# Patient Record
Sex: Female | Born: 1935 | Race: White | Hispanic: Yes | Smoking: Never smoker
Health system: Southern US, Community
[De-identification: ages and names within clinical notes are randomized; demographics above are authoritative.]

## PROBLEM LIST (undated history)

## (undated) DIAGNOSIS — F329 Major depressive disorder, single episode, unspecified: Secondary | ICD-10-CM

## (undated) DIAGNOSIS — C801 Malignant (primary) neoplasm, unspecified: Secondary | ICD-10-CM

## (undated) DIAGNOSIS — IMO0002 Reserved for concepts with insufficient information to code with codable children: Secondary | ICD-10-CM

## (undated) DIAGNOSIS — M810 Age-related osteoporosis without current pathological fracture: Secondary | ICD-10-CM

## (undated) DIAGNOSIS — F32A Depression, unspecified: Secondary | ICD-10-CM

## (undated) DIAGNOSIS — I10 Essential (primary) hypertension: Secondary | ICD-10-CM

## (undated) DIAGNOSIS — F419 Anxiety disorder, unspecified: Secondary | ICD-10-CM

## (undated) DIAGNOSIS — M1990 Unspecified osteoarthritis, unspecified site: Secondary | ICD-10-CM

## (undated) DIAGNOSIS — E119 Type 2 diabetes mellitus without complications: Secondary | ICD-10-CM

## (undated) DIAGNOSIS — IMO0001 Reserved for inherently not codable concepts without codable children: Secondary | ICD-10-CM

## (undated) DIAGNOSIS — N6320 Unspecified lump in the left breast, unspecified quadrant: Secondary | ICD-10-CM

## (undated) DIAGNOSIS — R569 Unspecified convulsions: Secondary | ICD-10-CM

## (undated) DIAGNOSIS — K219 Gastro-esophageal reflux disease without esophagitis: Secondary | ICD-10-CM

## (undated) HISTORY — PX: CERVICAL BIOPSY: SHX590

## (undated) HISTORY — DX: Reserved for inherently not codable concepts without codable children: IMO0001

## (undated) HISTORY — DX: Reserved for concepts with insufficient information to code with codable children: IMO0002

---

## 2007-08-06 ENCOUNTER — Emergency Department (HOSPITAL_COMMUNITY): Admission: EM | Admit: 2007-08-06 | Discharge: 2007-08-06 | Payer: Self-pay | Admitting: Emergency Medicine

## 2014-12-14 DIAGNOSIS — R569 Unspecified convulsions: Secondary | ICD-10-CM

## 2014-12-14 HISTORY — DX: Unspecified convulsions: R56.9

## 2014-12-15 ENCOUNTER — Emergency Department (HOSPITAL_COMMUNITY): Payer: Medicare Other

## 2014-12-15 ENCOUNTER — Encounter (HOSPITAL_COMMUNITY): Payer: Self-pay | Admitting: *Deleted

## 2014-12-15 ENCOUNTER — Observation Stay (HOSPITAL_COMMUNITY)
Admission: EM | Admit: 2014-12-15 | Discharge: 2014-12-16 | Disposition: A | Discharge status: Home / Self Care | Payer: Medicare Other | Attending: Internal Medicine | Admitting: Internal Medicine

## 2014-12-15 DIAGNOSIS — M810 Age-related osteoporosis without current pathological fracture: Secondary | ICD-10-CM | POA: Diagnosis not present

## 2014-12-15 DIAGNOSIS — Z79899 Other long term (current) drug therapy: Secondary | ICD-10-CM | POA: Diagnosis not present

## 2014-12-15 DIAGNOSIS — F329 Major depressive disorder, single episode, unspecified: Secondary | ICD-10-CM | POA: Insufficient documentation

## 2014-12-15 DIAGNOSIS — E1165 Type 2 diabetes mellitus with hyperglycemia: Secondary | ICD-10-CM | POA: Insufficient documentation

## 2014-12-15 DIAGNOSIS — N63 Unspecified lump in breast: Secondary | ICD-10-CM | POA: Diagnosis not present

## 2014-12-15 DIAGNOSIS — E871 Hypo-osmolality and hyponatremia: Secondary | ICD-10-CM | POA: Diagnosis not present

## 2014-12-15 DIAGNOSIS — N6320 Unspecified lump in the left breast, unspecified quadrant: Secondary | ICD-10-CM | POA: Diagnosis present

## 2014-12-15 DIAGNOSIS — E119 Type 2 diabetes mellitus without complications: Secondary | ICD-10-CM

## 2014-12-15 DIAGNOSIS — M1990 Unspecified osteoarthritis, unspecified site: Secondary | ICD-10-CM | POA: Diagnosis not present

## 2014-12-15 DIAGNOSIS — R630 Anorexia: Secondary | ICD-10-CM | POA: Diagnosis not present

## 2014-12-15 DIAGNOSIS — I10 Essential (primary) hypertension: Secondary | ICD-10-CM | POA: Diagnosis not present

## 2014-12-15 DIAGNOSIS — R55 Syncope and collapse: Secondary | ICD-10-CM | POA: Diagnosis not present

## 2014-12-15 DIAGNOSIS — R000 Tachycardia, unspecified: Secondary | ICD-10-CM | POA: Diagnosis not present

## 2014-12-15 DIAGNOSIS — E860 Dehydration: Secondary | ICD-10-CM | POA: Diagnosis not present

## 2014-12-15 DIAGNOSIS — R5383 Other fatigue: Secondary | ICD-10-CM

## 2014-12-15 DIAGNOSIS — R739 Hyperglycemia, unspecified: Secondary | ICD-10-CM

## 2014-12-15 HISTORY — DX: Unspecified lump in the left breast, unspecified quadrant: N63.20

## 2014-12-15 HISTORY — DX: Depression, unspecified: F32.A

## 2014-12-15 HISTORY — DX: Gastro-esophageal reflux disease without esophagitis: K21.9

## 2014-12-15 HISTORY — DX: Unspecified convulsions: R56.9

## 2014-12-15 HISTORY — DX: Essential (primary) hypertension: I10

## 2014-12-15 HISTORY — DX: Unspecified osteoarthritis, unspecified site: M19.90

## 2014-12-15 HISTORY — DX: Age-related osteoporosis without current pathological fracture: M81.0

## 2014-12-15 HISTORY — DX: Major depressive disorder, single episode, unspecified: F32.9

## 2014-12-15 HISTORY — DX: Type 2 diabetes mellitus without complications: E11.9

## 2014-12-15 LAB — CBC WITH DIFFERENTIAL/PLATELET
BASOS PCT: 0 % (ref 0–1)
Basophils Absolute: 0 10*3/uL (ref 0.0–0.1)
EOS ABS: 0 10*3/uL (ref 0.0–0.7)
Eosinophils Relative: 0 % (ref 0–5)
HCT: 38 % (ref 36.0–46.0)
HEMOGLOBIN: 12.9 g/dL (ref 12.0–15.0)
LYMPHS ABS: 1.2 10*3/uL (ref 0.7–4.0)
Lymphocytes Relative: 13 % (ref 12–46)
MCH: 28.3 pg (ref 26.0–34.0)
MCHC: 33.9 g/dL (ref 30.0–36.0)
MCV: 83.3 fL (ref 78.0–100.0)
Monocytes Absolute: 0.4 10*3/uL (ref 0.1–1.0)
Monocytes Relative: 4 % (ref 3–12)
NEUTROS PCT: 83 % — AB (ref 43–77)
Neutro Abs: 7.9 10*3/uL — ABNORMAL HIGH (ref 1.7–7.7)
Platelets: 336 10*3/uL (ref 150–400)
RBC: 4.56 MIL/uL (ref 3.87–5.11)
RDW: 13.1 % (ref 11.5–15.5)
WBC: 9.5 10*3/uL (ref 4.0–10.5)

## 2014-12-15 LAB — BASIC METABOLIC PANEL
ANION GAP: 11 (ref 5–15)
BUN: 10 mg/dL (ref 6–23)
CALCIUM: 10 mg/dL (ref 8.4–10.5)
CO2: 21 mmol/L (ref 19–32)
CREATININE: 0.97 mg/dL (ref 0.50–1.10)
Chloride: 90 mmol/L — ABNORMAL LOW (ref 96–112)
GFR calc Af Amer: 64 mL/min — ABNORMAL LOW (ref >90)
GFR calc non Af Amer: 55 mL/min — ABNORMAL LOW (ref >90)
Glucose, Bld: 225 mg/dL — ABNORMAL HIGH (ref 70–99)
Potassium: 4.6 mmol/L (ref 3.5–5.1)
SODIUM: 122 mmol/L — AB (ref 135–145)

## 2014-12-15 LAB — URINALYSIS W MICROSCOPIC (NOT AT ARMC)
Bilirubin Urine: NEGATIVE
Glucose, UA: NEGATIVE mg/dL
Hgb urine dipstick: NEGATIVE
Ketones, ur: NEGATIVE mg/dL
Leukocytes, UA: NEGATIVE
NITRITE: NEGATIVE
PH: 7 (ref 5.0–8.0)
Protein, ur: NEGATIVE mg/dL
SPECIFIC GRAVITY, URINE: 1.009 (ref 1.005–1.030)
Urobilinogen, UA: 0.2 mg/dL (ref 0.0–1.0)

## 2014-12-15 LAB — POC OCCULT BLOOD, ED: FECAL OCCULT BLD: NEGATIVE

## 2014-12-15 LAB — GLUCOSE, CAPILLARY: GLUCOSE-CAPILLARY: 112 mg/dL — AB (ref 70–99)

## 2014-12-15 LAB — TROPONIN I: Troponin I: <0.03 ng/mL (ref <0.031)

## 2014-12-15 MED ORDER — IOHEXOL 350 MG/ML SOLN
80.0000 mL | Freq: Once | INTRAVENOUS | Status: AC | PRN
  Administered 2014-12-15: 80 mL via INTRAVENOUS

## 2014-12-15 MED ORDER — ENOXAPARIN SODIUM 40 MG/0.4ML ~~LOC~~ SOLN
40.0000 mg | SUBCUTANEOUS | Status: DC
  Administered 2014-12-15: 40 mg via SUBCUTANEOUS
  Filled 2014-12-15 (×3): qty 0.4

## 2014-12-15 MED ORDER — SODIUM CHLORIDE 0.9 % IJ SOLN: 3.0000 mL | Freq: Two times a day (BID) | INTRAMUSCULAR | Status: DC

## 2014-12-15 MED ORDER — SODIUM CHLORIDE 0.9 % IV SOLN: INTRAVENOUS | Status: DC

## 2014-12-15 MED ORDER — PANTOPRAZOLE SODIUM 40 MG PO TBEC
40.0000 mg | DELAYED_RELEASE_TABLET | Freq: Every day | ORAL | Status: DC
  Administered 2014-12-15 – 2014-12-16 (×2): 40 mg via ORAL
  Filled 2014-12-15 (×2): qty 1

## 2014-12-15 MED ORDER — SODIUM CHLORIDE 0.9 % IV SOLN
INTRAVENOUS | Status: AC
  Administered 2014-12-15 – 2014-12-16 (×2): via INTRAVENOUS

## 2014-12-15 MED ORDER — CALCIUM CARBONATE-VITAMIN D 500-200 MG-UNIT PO TABS
1.0000 | ORAL_TABLET | Freq: Every day | ORAL | Status: DC
  Administered 2014-12-15 – 2014-12-16 (×2): 1 via ORAL
  Filled 2014-12-15 (×3): qty 1

## 2014-12-15 MED ORDER — ACETAMINOPHEN 325 MG PO TABS: 650.0000 mg | ORAL_TABLET | Freq: Four times a day (QID) | ORAL | Status: DC | PRN

## 2014-12-15 MED ORDER — INSULIN ASPART 100 UNIT/ML ~~LOC~~ SOLN
0.0000 [IU] | Freq: Three times a day (TID) | SUBCUTANEOUS | Status: DC
  Administered 2014-12-15: 2 [IU] via SUBCUTANEOUS

## 2014-12-15 MED ORDER — ACETAMINOPHEN 650 MG RE SUPP: 650.0000 mg | Freq: Four times a day (QID) | RECTAL | Status: DC | PRN

## 2014-12-15 MED ORDER — ONDANSETRON HCL 4 MG/2ML IJ SOLN: 4.0000 mg | Freq: Four times a day (QID) | INTRAMUSCULAR | Status: DC | PRN

## 2014-12-15 MED ORDER — CYCLOBENZAPRINE HCL 10 MG PO TABS
10.0000 mg | ORAL_TABLET | Freq: Every day | ORAL | Status: DC
  Administered 2014-12-15 – 2014-12-16 (×2): 10 mg via ORAL
  Filled 2014-12-15 (×3): qty 1

## 2014-12-15 MED ORDER — SODIUM CHLORIDE 0.9 % IV SOLN: Freq: Once | INTRAVENOUS | Status: DC

## 2014-12-15 MED ORDER — MELOXICAM 15 MG PO TABS
15.0000 mg | ORAL_TABLET | Freq: Every day | ORAL | Status: DC
  Administered 2014-12-15 – 2014-12-16 (×2): 15 mg via ORAL
  Filled 2014-12-15 (×3): qty 1

## 2014-12-15 MED ORDER — SODIUM CHLORIDE 0.9 % IV BOLUS (SEPSIS)
500.0000 mL | Freq: Once | INTRAVENOUS | Status: AC
  Administered 2014-12-15: 500 mL via INTRAVENOUS

## 2014-12-15 MED ORDER — ALBUTEROL SULFATE (2.5 MG/3ML) 0.083% IN NEBU: 2.5000 mg | INHALATION_SOLUTION | RESPIRATORY_TRACT | Status: DC | PRN

## 2014-12-15 MED ORDER — SODIUM CHLORIDE 0.9 % IV BOLUS (SEPSIS): 1000.0000 mL | Freq: Once | INTRAVENOUS | Status: DC

## 2014-12-15 MED ORDER — ONDANSETRON HCL 4 MG PO TABS: 4.0000 mg | ORAL_TABLET | Freq: Four times a day (QID) | ORAL | Status: DC | PRN

## 2014-12-15 MED ORDER — LOSARTAN POTASSIUM 50 MG PO TABS
50.0000 mg | ORAL_TABLET | Freq: Every day | ORAL | Status: DC
  Administered 2014-12-15 – 2014-12-16 (×2): 50 mg via ORAL
  Filled 2014-12-15 (×3): qty 1

## 2014-12-15 NOTE — ED Notes (Signed)
Daughter's number Georgeanna Harrison [phone]

## 2014-12-15 NOTE — ED Provider Notes (Signed)
CSN: [id number]     Arrival date & time 12/15/14  1047 History   First MD Initiated Contact with Patient 12/15/14 1113     Chief Complaint  Patient presents with  . Loss of Consciousness     (Consider location/radiation/quality/duration/timing/severity/associated sxs/prior Treatment) HPI Comments: 80 year old female with high blood pressure, osteoporosis, diabetes presents after syncope. Patient had syncopal episode last night or sudden onset. Patient felt nauseated since in general fatigue. No cardiac history known. Patient has frontal headache gradual onset. Patient had arm infection 3 weeks prior and prescribed antibiotics. No current antibiotics. Patient did not want to be seen last night however symptoms persisted no syncope today. No valve problems known. Patient has noticed left breast mass the past 3 weeks no active cancer, no blood clot history, no recent surgeries, no unilateral leg symptoms.  Patient is a 80 y.o. female presenting with syncope. The history is provided by the patient and a relative. A language interpreter was used.  Loss of Consciousness Associated symptoms: headaches   Associated symptoms: no chest pain, no fever, no shortness of breath and no vomiting     Past Medical History  Diagnosis Date  . Diabetes mellitus without complication   . Hypertension   . Osteoporosis    History reviewed. No pertinent past surgical history. History reviewed. No pertinent family history. History  Substance Use Topics  . Smoking status: Never Smoker   . Smokeless tobacco: Not on file  . Alcohol Use: No   OB History    No data available     Review of Systems  Constitutional: Positive for appetite change and fatigue. Negative for fever and chills.  HENT: Negative for congestion.   Eyes: Negative for visual disturbance.  Respiratory: Negative for shortness of breath.   Cardiovascular: Positive for syncope. Negative for chest pain.  Gastrointestinal: Negative for  vomiting and abdominal pain.  Genitourinary: Negative for dysuria and flank pain.  Musculoskeletal: Negative for back pain, neck pain and neck stiffness.  Skin: Negative for rash.  Neurological: Positive for syncope, light-headedness and headaches.      Allergies  Review of patient's allergies indicates no known allergies.  Home Medications   Prior to Admission medications   Medication Sig Start Date End Date Taking? Authorizing Provider  alendronate (FOSAMAX) 70 MG tablet Take 70 mg by mouth once a week. Take with a full glass of water on an empty stomach.   Yes Historical Provider, MD  calcium-vitamin D (OSCAL WITH D) 250-125 MG-UNIT per tablet Take 1 tablet by mouth daily.   Yes Historical Provider, MD  cyclobenzaprine (FLEXERIL) 10 MG tablet Take 10 mg by mouth daily.   Yes Historical Provider, MD  linagliptin (TRADJENTA) 5 MG TABS tablet Take 5 mg by mouth daily.   Yes Historical Provider, MD  losartan-hydrochlorothiazide (HYZAAR) 50-12.5 MG per tablet Take 1 tablet by mouth daily.   Yes Historical Provider, MD  meloxicam (MOBIC) 15 MG tablet Take 15 mg by mouth daily.   Yes Historical Provider, MD  omeprazole (PRILOSEC) 40 MG capsule Take 40 mg by mouth daily.   Yes Historical Provider, MD   BP 139/68 mmHg  Pulse 98  Temp(Src) 98.6 F (37 C) (Rectal)  Resp 15  SpO2 99% Physical Exam  Constitutional: She is oriented to person, place, and time. She appears well-developed and well-nourished.  HENT:  Head: Normocephalic and atraumatic.  Mild dry membranes, no meningismus  Eyes: Conjunctivae are normal. Right eye exhibits no discharge. Left eye exhibits no discharge.  Neck: Normal range of motion. Neck supple. No tracheal deviation present.  Cardiovascular: Normal rate and regular rhythm.   Pulmonary/Chest: Effort normal and breath sounds normal.  Abdominal: Soft. She exhibits no distension. There is no tenderness. There is no guarding.  Musculoskeletal: She exhibits no  edema.  Neurological: She is alert and oriented to person, place, and time.  5+ strength in UE and LE with f/e at major joints. Sensation to palpation intact in UE and LE. CNs 2-12 grossly intact.  EOMFI.  PERRL.   Finger nose and coordination intact bilateral.   Visual fields intact to finger testing.   Skin: Skin is warm. No rash noted.  Patient has approximately 1.5 cm nodule left superior outer nipple region, no discharge the nipple nontender no erythema or warmth.  Psychiatric: She has a normal mood and affect.  Nursing note and vitals reviewed.   ED Course  Procedures (including critical care time) Labs Review Labs Reviewed  BASIC METABOLIC PANEL - Abnormal; Notable for the following:    Sodium 122 (*)    Chloride 90 (*)    Glucose, Bld 225 (*)    GFR calc non Af Amer 55 (*)    GFR calc Af Amer 64 (*)    All other components within normal limits  CBC WITH DIFFERENTIAL/PLATELET - Abnormal; Notable for the following:    Neutrophils Relative % 83 (*)    Neutro Abs 7.9 (*)    All other components within normal limits  TROPONIN I  URINALYSIS W MICROSCOPIC  POC OCCULT BLOOD, ED    Imaging Review Ct Head Wo Contrast  12/15/2014   CLINICAL DATA:  Syncopal episode last night, weakness, hypertension, diabetes  EXAM: CT HEAD WITHOUT CONTRAST  TECHNIQUE: Contiguous axial images were obtained from the base of the skull through the vertex without intravenous contrast.  COMPARISON:  None  FINDINGS: Generalized atrophy.  Normal ventricular morphology.  No midline shift or mass effect.  Mild minimal vessel chronic ischemic changes of deep cerebral white matter.  No intracranial hemorrhage, mass lesion, or acute infarction.  Small amount of fluid in LEFT frontal sinus.  Visualized paranasal sinuses and mastoid air cells otherwise clear.  Bones unremarkable.  IMPRESSION: Minimal small vessel chronic ischemic changes of deep cerebral white matter.  No acute intracranial abnormalities.    Electronically Signed   By: Lavonia Dana M.D.   On: 12/15/2014 13:15   Ct Angio Chest Pe W/cm &/or Wo Cm  12/15/2014   CLINICAL DATA:  Syncope. Fatigue and dizziness. Loss of consciousness.  EXAM: CT ANGIOGRAPHY CHEST WITH CONTRAST  TECHNIQUE: Multidetector CT imaging of the chest was performed using the standard protocol during bolus administration of intravenous contrast. Multiplanar CT image reconstructions and MIPs were obtained to evaluate the vascular anatomy.  CONTRAST:  63mL OMNIPAQUE IOHEXOL 350 MG/ML SOLN  COMPARISON:  None.  FINDINGS: Negative for pulmonary embolism. Negative for aortic dissection or aneurysm. Heart size within normal limits. Minimal coronary artery calcification.  Small calcified granulomata in the lung apices bilaterally. Negative for mass or adenopathy  Negative for pneumonia. Negative for pleural effusion. Mild dependent atelectasis bilaterally.  Large hemangioma fills the T8 vertebral body without fracture or extraosseous soft tissue component.  Upper abdomen negative  Review of the MIP images confirms the above findings.  IMPRESSION: Negative for pulmonary emboli.  No acute abnormality.   Electronically Signed   By: Franchot Gallo M.D.   On: 12/15/2014 13:44     EKG Interpretation   Date/Time:  Monday December 15 2014 10:49:19 EST Ventricular Rate:  104 PR Interval:  200 QRS Duration: 64 QT Interval:  332 QTC Calculation: 436 R Axis:   -57 Text Interpretation:  Sinus tachycardia Left axis deviation Low voltage  QRS Cannot rule out Anterior infarct , age undetermined Abnormal ECG  Confirmed by Ahkeem Goede  MD, Misk Galentine ([id number]) on 12/15/2014 11:38:06 AM      MDM   Final diagnoses:  Hyponatremia  Other fatigue  Hyperglycemia  Dehydration  Left breast mass   Patient presents with new-onset syncope, no car tachycardia history known. Patient clinically dehydrated, fluid bolus given. Sodium low, patient on hydrochlorothiazide. CT angina chest no blood clot, EKG mild  tachycardia reviewed.  Discussed with hospitalist for observation and IV fluids. CT head results reviewed no acute findings.  The patients results and plan were reviewed and discussed.   Any x-rays performed were personally reviewed by myself.   Differential diagnosis were considered with the presenting HPI.  Medications  0.9 %  sodium chloride infusion (not administered)  sodium chloride 0.9 % bolus 500 mL (0 mLs Intravenous Stopped 12/15/14 1244)  iohexol (OMNIPAQUE) 350 MG/ML injection 80 mL (80 mLs Intravenous Contrast Given 12/15/14 1253)  0.9 %  sodium chloride infusion ( Intravenous New Bag/Given 12/15/14 1358)    Filed Vitals:   12/15/14 1054 12/15/14 1145 12/15/14 1213 12/15/14 1215  BP: 147/76 130/70  139/68  Pulse: 104 88  98  Temp: 99 F (37.2 C)  98.6 F (37 C)   TempSrc: Oral  Rectal   Resp: 18 15  15   SpO2: 98% 97%  99%    Final diagnoses:  Hyponatremia  Other fatigue  Hyperglycemia  Dehydration  Left breast mass    Admission/ observation were discussed with the admitting physician, patient and/or family and they are comfortable with the plan.     Mariea Clonts, MD 12/15/14 [phone]
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
## 2014-12-15 NOTE — ED Notes (Signed)
Per family, patient was drinking coffee last night and started to feel sick. States that she got up to rinse cup out and passed out for approx 1 min with eyes rolled back. States prior to passing out patient felt dizzy, denies CP. States afterward patient was confused and tired. Reports patient currently feels fatigued and has a frontal HA. Increased dizziness with standing. Family states 3 weeks ago patient had a fever and was prescribed antibiotics from PCP for a left hand/arm infection that has cleared up. Hx of diabetes and HTN. Family checked CBG when she passed out and said it was 164. States CBG was 124 this AM.

## 2014-12-15 NOTE — ED Notes (Signed)
Pt and family reports pt had syncopal episode last night, denies pain but is having fatigue since last night.

## 2014-12-15 NOTE — Progress Notes (Signed)
Patient arrived on unit via stretcher from ED. No family at bedside. Telemetry placed per MD order.

## 2014-12-16 DIAGNOSIS — E119 Type 2 diabetes mellitus without complications: Secondary | ICD-10-CM | POA: Diagnosis not present

## 2014-12-16 DIAGNOSIS — E860 Dehydration: Secondary | ICD-10-CM | POA: Diagnosis not present

## 2014-12-16 DIAGNOSIS — E871 Hypo-osmolality and hyponatremia: Secondary | ICD-10-CM | POA: Diagnosis not present

## 2014-12-16 DIAGNOSIS — R55 Syncope and collapse: Secondary | ICD-10-CM | POA: Diagnosis not present

## 2014-12-16 LAB — CBC
HEMATOCRIT: 34.8 % — AB (ref 36.0–46.0)
HEMOGLOBIN: 11.7 g/dL — AB (ref 12.0–15.0)
MCH: 28.6 pg (ref 26.0–34.0)
MCHC: 33.6 g/dL (ref 30.0–36.0)
MCV: 85.1 fL (ref 78.0–100.0)
Platelets: 286 10*3/uL (ref 150–400)
RBC: 4.09 MIL/uL (ref 3.87–5.11)
RDW: 13.3 % (ref 11.5–15.5)
WBC: 7 10*3/uL (ref 4.0–10.5)

## 2014-12-16 LAB — BASIC METABOLIC PANEL
ANION GAP: 10 (ref 5–15)
BUN: 10 mg/dL (ref 6–23)
CO2: 23 mmol/L (ref 19–32)
CREATININE: 0.81 mg/dL (ref 0.50–1.10)
Calcium: 9.3 mg/dL (ref 8.4–10.5)
Chloride: 102 mmol/L (ref 96–112)
GFR calc Af Amer: 79 mL/min — ABNORMAL LOW (ref >90)
GFR calc non Af Amer: 68 mL/min — ABNORMAL LOW (ref >90)
Glucose, Bld: 115 mg/dL — ABNORMAL HIGH (ref 70–99)
Potassium: 4.2 mmol/L (ref 3.5–5.1)
SODIUM: 135 mmol/L (ref 135–145)

## 2014-12-16 LAB — GLUCOSE, CAPILLARY
GLUCOSE-CAPILLARY: 179 mg/dL — AB (ref 70–99)
GLUCOSE-CAPILLARY: 131 mg/dL — AB (ref 70–99)
Glucose-Capillary: 121 mg/dL — ABNORMAL HIGH (ref 70–99)
Glucose-Capillary: 109 mg/dL — ABNORMAL HIGH (ref 70–99)

## 2014-12-16 MED ORDER — LOSARTAN POTASSIUM 50 MG PO TABS: 50.0000 mg | ORAL_TABLET | Freq: Every day | ORAL | Status: DC

## 2014-12-16 MED ORDER — POLYETHYLENE GLYCOL 3350 17 G PO PACK
17.0000 g | PACK | Freq: Every day | ORAL | Status: DC
  Filled 2014-12-16: qty 1

## 2014-12-16 NOTE — Progress Notes (Signed)
UR completed 

## 2014-12-16 NOTE — Progress Notes (Signed)
Echocardiogram 2D Echocardiogram has been performed.  Amanda Schneider 12/16/2014, 11:37 AM
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
## 2014-12-16 NOTE — Evaluation (Signed)
Physical Therapy Evaluation Patient Details Name: Amanda Schneider MRN: [id number] DOB: 27-Nov-1934 Today's Date: 12/16/2014   History of Present Illness  Pt is a 80 y.o. female Spanish-speaking, PMH of DM 2, HTN, osteoporosis, presented to the [facility] ED on 12/15/14 with complaints of passing out 1 night PTA and fatigue. 3 weeks ago patient was treated for left hand infection with antibiotics. Since then patient has had poor appetite but no other complaints. Last night, she was in her usual state of health and returned from church at approximately 9 PM. She was having some coffee and half way through her drink, she started feeling upset in the stomach and nauseous. She got up and went to the kitchen sink to rinse her coffee cup when she abruptly passed out. The family saw her fall backwards onto the floor. She passed out for approximately 1 minute with eyes rolled upwards but no seizure-like activity or urinary incontinence. When she woke up she was transiently confused for about 20 minutes. She complained of some headache on the right occipital area at the site of trauma. This morning she woke up and complained of feeling generally fatigued and pain in the forehead.   Clinical Impression  Pt admitted with above diagnosis. Pt currently with functional limitations due to the deficits listed below (see PT Problem List). At the time of PT eval pt was able to perform transfers and ambulation with supervision or min guard assist. Pt able to ambulate well with the RW, and feel pt would benefit from using the walker in the community/long distances. Pt will benefit from skilled PT to increase their independence and safety with mobility to allow discharge to the venue listed below.  Pt unsure about HHPT to follow-up. Daughter reports that pt does not like being around strangers. Encouraged pt and daughter to consider having HHPT follow her at d/c for continued strengthening and balance training.       Follow Up Recommendations Home health PT    Equipment Recommendations  Rolling walker with 5" wheels (Tub bench/shower chair)    Recommendations for Other Services       Precautions / Restrictions Precautions Precautions: Fall Restrictions Weight Bearing Restrictions: No      Mobility  Bed Mobility Overal bed mobility: Needs Assistance Bed Mobility: Supine to Sit     Supine to sit: Supervision     General bed mobility comments: Pt was able to transition to EOB without assistance.   Transfers Overall transfer level: Needs assistance Equipment used: None Transfers: Sit to/from Stand Sit to Stand: Supervision         General transfer comment: Supervision for safety. Pt did not demonstrate unsteadiness or LOB.   Ambulation/Gait Ambulation/Gait assistance: Supervision;Min guard Ambulation Distance (Feet): 275 Feet Assistive device: Rolling walker (2 wheeled);None Gait Pattern/deviations: Step-through pattern;Decreased stride length;Trendelenburg Gait velocity: Decreased Gait velocity interpretation: Below normal speed for age/gender General Gait Details: Pt ambulated with the RW initially with supervision. No assist required. Once back in room pt was able to ambulate to the recliner with min guard assist and no LOB.   Stairs            Wheelchair Mobility    Modified Rankin (Stroke Patients Only)       Balance Overall balance assessment: No apparent balance deficits (not formally assessed)  Pertinent Vitals/Pain Pain Assessment: No/denies pain    Home Living Family/patient expects to be discharged to:: Private residence Living Arrangements: Children;Other relatives Available Help at Discharge: Family;Available 24 hours/day Type of Home: House Home Access: Stairs to enter   [employer] of Steps: 3 Home Layout: One level Home Equipment: Cane - single point      Prior  Function Level of Independence: Independent               Hand Dominance        Extremity/Trunk Assessment   Upper Extremity Assessment: Defer to OT evaluation           Lower Extremity Assessment: Overall WFL for tasks assessed      Cervical / Trunk Assessment: Normal  Communication   Communication: Prefers language other than [place];Interpreter utilized  Cognition Arousal/Alertness: Awake/alert Behavior During Therapy: WFL for tasks assessed/performed Overall Cognitive Status: Within Functional Limits for tasks assessed                      General Comments      Exercises        Assessment/Plan    PT Assessment Patient needs continued PT services  PT Diagnosis Difficulty walking   PT Problem List Decreased strength;Decreased range of motion;Decreased activity tolerance;Decreased balance;Decreased mobility;Decreased knowledge of use of DME;Decreased safety awareness;Decreased knowledge of precautions  PT Treatment Interventions DME instruction;Gait training;Stair training;Functional mobility training;Therapeutic activities;Therapeutic exercise;Neuromuscular re-education;Patient/family education   PT Goals (Current goals can be found in the Care Plan section) Acute Rehab PT Goals Patient Stated Goal: Pt did not state goals at this time.  PT Goal Formulation: With patient/family Time For Goal Achievement: 12/23/14 Potential to Achieve Goals: Good    Frequency Min 3X/week   Barriers to discharge        Co-evaluation               End of Session Equipment Utilized During Treatment: Gait belt Activity Tolerance: Patient tolerated treatment well Patient left: in chair;with call bell/phone within reach;with family/visitor present Nurse Communication: Mobility status    Functional Assessment Tool Used: Clinical judgement Functional Limitation: Mobility: Walking and moving around Mobility: Walking and Moving Around Current Status [phone]):  At least 1 percent but less than 20 percent impaired, limited or restricted Mobility: Walking and Moving Around Goal Status [phone]): At least 1 percent but less than 20 percent impaired, limited or restricted    Time: 1323-1345 PT Time Calculation (min) (ACUTE ONLY): 22 min   Charges:   PT Evaluation $Initial PT Evaluation Tier I: 1 Procedure     PT G Codes:   PT G-Codes **NOT FOR INPATIENT CLASS** Functional Assessment Tool Used: Clinical judgement Functional Limitation: Mobility: Walking and moving around Mobility: Walking and Moving Around Current Status (Y8016): At least 1 percent but less than 20 percent impaired, limited or restricted Mobility: Walking and Moving Around Goal Status [phone]): At least 1 percent but less than 20 percent impaired, limited or restricted    Rolinda Roan 12/16/2014, 2:01 PM   Rolinda Roan, PT, DPT Acute Rehabilitation Services Pager: [phone]

## 2014-12-16 NOTE — Care Management Note (Signed)
CARE MANAGEMENT NOTE 12/16/2014  Patient:  [facility]   Account Number:  [id number]  Date Initiated:  12/16/2014  Documentation initiated by:  Yader Criger  Subjective/Objective Assessment:   CM following for progression and d/c planning.     Action/Plan:   HHPT and Rolling walker ordered for this pt , family may decline HH once [facility] agency calls the home, however arrangements have been made.   Anticipated DC Date:  12/16/2014   Anticipated DC Plan:  [place]         Choice offered to / List presented to:     DME arranged  [place]      DME agency  [place] arranged  [place].   Status of service:  Completed, signed off Medicare Important Message given?   (If response is "NO", the following Medicare IM given date fields will be blank) Date Medicare IM given:   Medicare IM given by:   Date Additional Medicare IM given:   Additional Medicare IM given by:    Discharge Disposition:  [place]  Per UR Regulation:    If discussed at Long Length of Stay Meetings, dates discussed:    Comments:  12/16/2014 Pt will need an appointment at [place] to qualify for [facility] services. This CM attempted to discuss with pt daughter, however the daughter is not available. Will attempt to schedule an appointment for this pt tomorrow am.  Jasmine Pang RN MPH , case management [phone]

## 2014-12-17 NOTE — Care Management Note (Signed)
Late Entry:  CARE MANAGEMENT NOTE 12/17/2014  Patient:  A[facility]   Account Number:  [id number]  Date Initiated:  12/16/2014  Documentation initiated by:  Jean Skow  Subjective/Objective Assessment:   CM following for progression and d/c planning.     Action/Plan:   HHPT and Rolling walker ordered for this pt , family may decline HH once [facility] agency calls the home, however arrangements have been made.   Anticipated DC Date:  12/16/2014   Anticipated DC Plan:  [place]         Choice offered to / List presented to:     DME arranged  [place]      DME agency  [place] arranged  [place].   Status of service:  Completed, signed off Medicare Important Message given?   (If response is "NO", the following Medicare IM given date fields will be blank) Date Medicare IM given:   Medicare IM given by:   Date Additional Medicare IM given:   Additional Medicare IM given by:    Discharge Disposition:  [place]  Per UR Regulation:    If discussed at Long Length of Stay Meetings, dates discussed:    Comments:  12/17/2014 Appointment scheduled at [facility], then cancelled by this CM as we learned that the pt has a PCP in [place] [facility], Dr York Ram, [facility] notified of pt PCP.  CRoyal RN MPH, case manager, [phone]   12/16/2014 Pt will need an appointment at [place] to qualify for [facility] services. This CM attempted to discuss with pt daughter, however the daughter is not available. Will attempt to schedule an appointment for this pt tomorrow am.  Jasmine Pang RN MPH , case management [phone]

## 2014-12-19 ENCOUNTER — Inpatient Hospital Stay: Payer: Medicare Other

## 2015-01-06 ENCOUNTER — Other Ambulatory Visit: Payer: Self-pay | Admitting: Radiology

## 2015-01-19 ENCOUNTER — Other Ambulatory Visit: Payer: Self-pay | Admitting: Surgery

## 2015-01-20 ENCOUNTER — Encounter: Payer: Self-pay | Admitting: Hematology and Oncology

## 2015-01-20 ENCOUNTER — Ambulatory Visit (HOSPITAL_BASED_OUTPATIENT_CLINIC_OR_DEPARTMENT_OTHER): Payer: Medicare Other | Admitting: Hematology and Oncology

## 2015-01-20 ENCOUNTER — Ambulatory Visit: Payer: Medicare Other

## 2015-01-20 VITALS — BP 189/93 | HR 81 | Temp 97.5°F | Resp 18 | Ht 62.0 in | Wt 139.5 lb

## 2015-01-20 DIAGNOSIS — C50112 Malignant neoplasm of central portion of left female breast: Secondary | ICD-10-CM

## 2015-01-20 DIAGNOSIS — Z170 Estrogen receptor positive status [ER+]: Secondary | ICD-10-CM

## 2015-01-20 DIAGNOSIS — C50912 Malignant neoplasm of unspecified site of left female breast: Secondary | ICD-10-CM | POA: Insufficient documentation

## 2015-01-20 NOTE — Progress Notes (Signed)
Reports rcvd from [facility] 12/30/14 - 3d mammo, ultrasound, [place] bx, mammo post procedure, and 01/08/15 ofc visit notes.  Reviewed by Dr. Lindi Adie.  Sent to scan.  [facility] new pt referral rcvd with notes attached from Dr. [name].  Reviewed by Dr. Lindi Adie.  Sent to scan.  MD note created during office visit sent to scan.  Copy to patient.

## 2015-01-20 NOTE — Progress Notes (Signed)
Checked in new pt with no financial concerns at this time.  Pt has 2 insurances so financial assistance may not be needed but she has my card for any billing questions or concerns.  ° °

## 2015-01-20 NOTE — Progress Notes (Signed)
Location of Breast Cancer:left breast 1.2 cm lobulated mass  Histology per Pathology Report:01/06/15 Diagnosis Breast, left, needle core biopsy, 12 o'clock retroareolar area - INVASIVE DUCTAL CARCINOMA, SEE COMMENT.  Receptor Status: ER(+, PR (+), Her2-neu (-)  Did patient present with symptoms (if so, please note symptoms) or was this found on screening mammography?:Patient palpated mass during shower which was confirmed by mammogram and ultrasound at [facility].  Past/Anticipated interventions by surgeon, if any:  Past/Anticipated interventions by medical oncology, if any: Chemotherapy, if oncotype less than 21 recommendation would be to have radiation followed by anti-estrogen. Awaiting oncotype DX  Lymphedema issues, if any:No  Pain issues, if any:No  SAFETY ISSUES:  Prior radiation?No  Pacemaker/ICD? No  Possible current pregnancy?No  Is the patient on methotrexate?No  Current Complaints / other details: Widowed Menarche age 4.first child age 66 or 86 10 children, 56 living, one died at 100 days. Last menstrual period age 66.No hormonal replacement therapy. Had 16 pregnancies. Lives with daughter Peter [place]. NKDA No smoking history Seizure 12/15/14    Arlyss Repress, RN 01/20/2015,4:04 PM

## 2015-01-20 NOTE — Assessment & Plan Note (Signed)
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 After reviewing the pathology in detail, we proceeded to discuss the different treatment options between surgery, radiation, chemotherapy, antiestrogen therapies.  Recommendation: 1. Breast conserving surgery followed by 2. Testing for Oncotype DX to determine if she would benefit from chemotherapy followed by 3. Radiation therapy followed by 4. Antiestrogen therapy.  Oncotype DX counseling: I discussed with the patient Oncotype DX is a 21 gene assay that can help predict if someone needs chemotherapy but based on her clinical criteria it appears that she may be low risk. However patient would like to know if she does need chemotherapy and hence I will send for Oncotype DX testing after we review the final pathology.  Return to clinic after surgery to discuss the final pathology result.

## 2015-01-20 NOTE — Progress Notes (Signed)
[facility] CONSULT NOTE  Patient Care Team: Pcp Not In System as PCP - General  CHIEF COMPLAINTS/PURPOSE OF CONSULTATION:  Newly diagnosed breast cancer  HISTORY OF PRESENTING ILLNESS:  Amanda Schneider 80 y.o. female is here because of recent diagnosis of left breast cancer. She is a Hispanic speaking lady who is here accompanied by her daughter. She was having a rash shower and felt a lump in the left breast. This was evaluated for a mammogram and ultrasound at [facility] that revealed a 1.2 cm lesion in the left breast. This was biopsied on 01/04/2015 and it revealed invasive ductal carcinoma that was ER/PR positive HER-2 negative. She was seen by Dr. Excell Seltzer for surgery and was referred to [facility] for discussion regarding adjuvant treatment options. She has not made up her mind regarding the type of surgery she wants because she understands that if she gets a lumpectomy she will need radiation therapy. She is otherwise very healthy and denies any other health issues or concerns.   I reviewed her records extensively and collaborated the history with the patient.  SUMMARY OF ONCOLOGIC HISTORY:   Breast cancer, left breast   01/06/2015 Initial Diagnosis Left breast 12:00 retroareolar invasive ductal carcinoma, grade 2, ER 100%, PR 95%, Ki-67 4%, HER-2 negative ratio 1.12    MEDICAL HISTORY:  Past Medical History  Diagnosis Date  . Hypertension   . Osteoporosis   . Type II diabetes mellitus   . GERD (gastroesophageal reflux disease)   . Seizures 12/14/2014    "just the one on 12/14/2014) (12/15/2014)  . Arthritis     "knees, arms" (12/15/2014)  . Depression   . Left breast mass     "just noticed it ~ 3 wks ago" (12/15/2014)    SURGICAL HISTORY: Past Surgical History  Procedure Laterality Date  . Cervical biopsy      SOCIAL HISTORY: History   Social History  . Marital Status: Widowed    Spouse Name: N/A  . Number of Children: N/A  . Years of Education: N/A    Occupational History  . Not on file.   Social History Main Topics  . Smoking status: Never Smoker   . Smokeless tobacco: Never Used  . Alcohol Use: No  . Drug Use: No  . Sexual Activity: Not on file   Other Topics Concern  . Not on file   Social History Narrative    FAMILY HISTORY: History reviewed. No pertinent family history.  ALLERGIES:  has No Known Allergies.  MEDICATIONS:  Current Outpatient Prescriptions  Medication Sig Dispense Refill  . alendronate (FOSAMAX) 70 MG tablet Take 70 mg by mouth once a week. Take with a full glass of water on an empty stomach.    . calcium-vitamin D (OSCAL WITH D) 250-125 MG-UNIT per tablet Take 1 tablet by mouth daily.    . cyclobenzaprine (FLEXERIL) 10 MG tablet Take 10 mg by mouth daily.    Marland Kitchen linagliptin (TRADJENTA) 5 MG TABS tablet Take 5 mg by mouth daily.    Marland Kitchen losartan (COZAAR) 50 MG tablet Take 1 tablet (50 mg total) by mouth daily. 30 tablet 0  . meloxicam (MOBIC) 15 MG tablet Take 15 mg by mouth daily.    Marland Kitchen omeprazole (PRILOSEC) 40 MG capsule Take 40 mg by mouth daily.     No current facility-administered medications for this visit.    REVIEW OF SYSTEMS:   Constitutional: Denies fevers, chills or abnormal night sweats Eyes: Denies blurriness of vision, double vision  or watery eyes Ears, nose, mouth, throat, and face: Denies mucositis or sore throat Respiratory: Denies cough, dyspnea or wheezes Cardiovascular: Denies palpitation, chest discomfort or lower extremity swelling Gastrointestinal:  Denies nausea, heartburn or change in bowel habits Skin: Denies abnormal skin rashes Lymphatics: Denies new lymphadenopathy or easy bruising Neurological:Denies numbness, tingling or new weaknesses Behavioral/Psych: Mood is stable, no new changes  Breast: Palpable lump in the left breast near the areola All other systems were reviewed with the patient and are negative.  PHYSICAL EXAMINATION: ECOG PERFORMANCE STATUS: 0 -  Asymptomatic  Filed Vitals:   01/20/15 1248  BP: 189/93  Pulse: 81  Temp: 97.5 F (36.4 C)  Resp: 18   Filed Weights   01/20/15 1248  Weight: 139 lb 8 oz (63.277 kg)    GENERAL:alert, no distress and comfortable SKIN: skin color, texture, turgor are normal, no rashes or significant lesions EYES: normal, conjunctiva are pink and non-injected, sclera clear OROPHARYNX:no exudate, no erythema and lips, buccal mucosa, and tongue normal  NECK: supple, thyroid normal size, non-tender, without nodularity LYMPH:  no palpable lymphadenopathy in the cervical, axillary or inguinal LUNGS: clear to auscultation and percussion with normal breathing effort HEART: regular rate & rhythm and no murmurs and no lower extremity edema ABDOMEN:abdomen soft, non-tender and normal bowel sounds Musculoskeletal:no cyanosis of digits and no clubbing  PSYCH: alert & oriented x 3 with fluent speech NEURO: no focal motor/sensory deficits BREAST: Palpable lump in the left breast about 1 cm in size lateral to the areola. No palpable axillary or supraclavicular lymphadenopathy (exam performed in the presence of a chaperone)   LABORATORY DATA:  I have reviewed the data as listed Lab Results  Component Value Date   WBC 7.0 12/16/2014   HGB 11.7* 12/16/2014   HCT 34.8* 12/16/2014   MCV 85.1 12/16/2014   PLT 286 12/16/2014   Lab Results  Component Value Date   NA 135 12/16/2014   K 4.2 12/16/2014   CL 102 12/16/2014   CO2 23 12/16/2014    RADIOGRAPHIC STUDIES: I have personally reviewed the radiological reports and agreed with the findings in the report. Results summarized as above  ASSESSMENT AND PLAN:  Breast cancer, left breast Left breast invasive ductal carcinoma 1.2 cm, T1c N0 M0 clinical stage IA ER positive PR positive HER-2 negative Ki-67 of 4%, grade 2  Pathology and radiology review:Discussed with the patient, the details of pathology including the type of breast cancer,the clinical  staging, the significance of ER, PR and HER-2/neu receptors and the implications for treatment. After reviewing the pathology in detail, we proceeded to discuss the different treatment options between surgery, radiation, chemotherapy, antiestrogen therapies.  Recommendation: 1. Breast conserving surgery followed by 2. Testing for Oncotype DX to determine if she would benefit from chemotherapy followed by 3. Radiation therapy followed by 4. Antiestrogen therapy.  Oncotype DX counseling: I discussed with the patient Oncotype DX is a 21 gene assay that can help predict if someone needs chemotherapy but based on her clinical criteria it appears that she may be low risk. However patient would like to know if she does need chemotherapy and hence I will send for Oncotype DX testing after we review the final pathology.  Return to clinic after surgery to discuss the final pathology result. All questions were answered. The patient knows to call the clinic with any problems, questions or concerns.    Rulon Eisenmenger, MD 4:13 PM

## 2015-01-21 ENCOUNTER — Ambulatory Visit
Admission: RE | Admit: 2015-01-21 | Discharge: 2015-01-21 | Disposition: A | Discharge status: Home / Self Care | Payer: Medicare Other | Source: Ambulatory Visit | Attending: Radiation Oncology | Admitting: Radiation Oncology

## 2015-01-21 ENCOUNTER — Encounter: Payer: Self-pay | Admitting: Radiation Oncology

## 2015-01-21 VITALS — BP 163/74 | HR 85 | Temp 97.7°F | Wt 141.0 lb

## 2015-01-21 DIAGNOSIS — C50912 Malignant neoplasm of unspecified site of left female breast: Secondary | ICD-10-CM | POA: Diagnosis not present

## 2015-01-21 NOTE — Progress Notes (Signed)
Please see the Nurse Progress Note in the MD Initial Consult Encounter for this patient. 

## 2015-01-21 NOTE — Progress Notes (Signed)
  Radiation Oncology         [phone]) [phone] ________________________________  Initial outpatient Consultation - Date: 01/21/2015   Name: Amanda Schneider MRN: [id number]   DOB: [birth year]  REFERRING PHYSICIAN: Lindi Adie  DIAGNOSIS: Stage I left breast cancer   HISTORY OF PRESENT ILLNESS::Amanda Schneider is a 80 y.o. female  Who felt a mass in her left breast. She sought evaluation including a mammogram and ultrasound which showed a 1.2 cm lesion. This was biopsied and revealed invasive ductal carcinoma ER/PR+ HER2 negative. She has seen Dr. Excell Seltzer and Dr. Lindi Adie. She is interested in breast conservation. COncotype has been discussed with her. She is seen today for consideration of radiation in the management of her disease. She is widowed and accompanied by her daughter. She is G1010 with menarche at 36. She is post menopausal.   PREVIOUS RADIATION THERAPY: No  Past medical, social and family history were reviewed in the electronic chart. Review of symptoms was reviewed in the electronic chart. Medications were reviewed in the electronic chart.   PHYSICAL EXAM:  Filed Vitals:   01/21/15 1045  BP: 163/74  Pulse: 85  Temp: 97.7 F (36.5 C)  .141 lb (63.957 kg). Pleasant female. No distress. Palpable mass at the 2 o'clock position of the left breast. No palpable abnormalities of the right breast. No palpable abnormalities of the bilateral axilla. Palpable mass at the 2 o'clock position of the left breast.   IMPRESSION: .T1N0 invasive ductal carcinoma.   PLAN:I spoke to the patient today regarding her diagnosis and options for treatment. We discussed the equivalence in terms of survival and local failure between mastectomy and breast conservation. We discussed the role of radiation in decreasing local failures in patients who undergo mastectomy and have positive lymph nodes or tumors greater than 5 cm. We discussed the role of radiation and decreasing local failures in patients who undergo  lumpectomy.We discussed the possible side effects including but not limited to asymptomatic rib, heart and lung damage, heart disease, skin redness, fatigue, permanent skin darkening, and chest wall or breast swelling.  We discussed the process of simulation and the placement tattoos. We discussed the low likelihood of secondary malignancies.    I will see her back after surgery and her Oncotype score has been discussed.   I spent 60 minutes  face to face with the patient and more than 50% of that time was spent in counseling and/or coordination of care.   ------------------------------------------------  Thea Silversmith, MD

## 2015-01-27 ENCOUNTER — Encounter: Payer: Self-pay | Admitting: *Deleted

## 2015-01-27 NOTE — Progress Notes (Signed)
Late entry- Met with pt during new pt appt with Dr. Pablo Ledger. Almyra Free spanish interpreter was present to interpret. Denies needs at this time. Gave navigation resources and contact information.

## 2015-01-29 ENCOUNTER — Other Ambulatory Visit: Payer: Self-pay | Admitting: Surgery

## 2015-01-29 DIAGNOSIS — C50912 Malignant neoplasm of unspecified site of left female breast: Secondary | ICD-10-CM

## 2015-01-30 ENCOUNTER — Encounter (HOSPITAL_BASED_OUTPATIENT_CLINIC_OR_DEPARTMENT_OTHER): Payer: Self-pay | Admitting: *Deleted

## 2015-01-30 NOTE — Progress Notes (Signed)
Daughter answered phone for pre op call, pt does not speak [place]. Daughter gave medical history at pt request. Spanish interpreter requested from [facility], spoke to Goodell at [phone]. She will call back with interpreter name.

## 2015-02-02 ENCOUNTER — Encounter (HOSPITAL_BASED_OUTPATIENT_CLINIC_OR_DEPARTMENT_OTHER)
Admission: RE | Admit: 2015-02-02 | Discharge: 2015-02-02 | Disposition: A | Discharge status: Home / Self Care | Payer: Medicare Other | Source: Ambulatory Visit | Attending: Surgery | Admitting: Surgery

## 2015-02-02 DIAGNOSIS — I10 Essential (primary) hypertension: Secondary | ICD-10-CM | POA: Diagnosis not present

## 2015-02-02 DIAGNOSIS — Z170 Estrogen receptor positive status [ER+]: Secondary | ICD-10-CM | POA: Diagnosis not present

## 2015-02-02 DIAGNOSIS — C50912 Malignant neoplasm of unspecified site of left female breast: Secondary | ICD-10-CM | POA: Diagnosis present

## 2015-02-02 DIAGNOSIS — Z791 Long term (current) use of non-steroidal anti-inflammatories (NSAID): Secondary | ICD-10-CM | POA: Diagnosis not present

## 2015-02-02 DIAGNOSIS — N6092 Unspecified benign mammary dysplasia of left breast: Secondary | ICD-10-CM | POA: Diagnosis not present

## 2015-02-02 DIAGNOSIS — N6012 Diffuse cystic mastopathy of left breast: Secondary | ICD-10-CM | POA: Diagnosis not present

## 2015-02-02 DIAGNOSIS — K219 Gastro-esophageal reflux disease without esophagitis: Secondary | ICD-10-CM | POA: Diagnosis not present

## 2015-02-02 DIAGNOSIS — E119 Type 2 diabetes mellitus without complications: Secondary | ICD-10-CM | POA: Diagnosis not present

## 2015-02-02 DIAGNOSIS — M1990 Unspecified osteoarthritis, unspecified site: Secondary | ICD-10-CM | POA: Diagnosis not present

## 2015-02-02 DIAGNOSIS — D242 Benign neoplasm of left breast: Secondary | ICD-10-CM | POA: Diagnosis not present

## 2015-02-02 DIAGNOSIS — Z79899 Other long term (current) drug therapy: Secondary | ICD-10-CM | POA: Diagnosis not present

## 2015-02-02 DIAGNOSIS — R921 Mammographic calcification found on diagnostic imaging of breast: Secondary | ICD-10-CM | POA: Diagnosis not present

## 2015-02-02 LAB — BASIC METABOLIC PANEL
ANION GAP: 10 (ref 5–15)
BUN: 11 mg/dL (ref 6–23)
CHLORIDE: 102 mmol/L (ref 96–112)
CO2: 24 mmol/L (ref 19–32)
CREATININE: 0.78 mg/dL (ref 0.50–1.10)
Calcium: 9.8 mg/dL (ref 8.4–10.5)
GFR calc non Af Amer: 79 mL/min — ABNORMAL LOW (ref >90)
GLUCOSE: 130 mg/dL — AB (ref 70–99)
Potassium: 5 mmol/L (ref 3.5–5.1)
SODIUM: 136 mmol/L (ref 135–145)

## 2015-02-02 NOTE — Pre-Procedure Instructions (Signed)
Dorita will be interpreter for pt., per Judy at [facility]; please call [phone] if surgery time changes. 

## 2015-02-02 NOTE — H&P (Signed)
Amanda Schneider 01/19/2015 10:07 AM Location: [facility] Patient #: [id number] DOB: [birth year] Widowed / Language: Spanish / Race: Undefined Female  History of Present Illness (Amanda Schneider A. Amanda Linden MD; 01/19/2015 10:32 AM) The patient is a 80 year old female who presents with breast cancer. This is a pleasant female referred by Wilmer Floor for the evaluation of a newly diagnosed left breast cancer. The patient reports feeling a mass in her left breast approximately 1 month ago. It causes slight discomfort. She denies nipple discharge. She has had since that time a mammogram and ultrasound demonstrating a left breast mass. Stereotactic biopsy was performed showing an ER and PR-positive invasive ductal carcinoma. She has had no previous breast biopsies or problems with her breasts. Her family history is negative for breast cancer. She is otherwise without complaints.   Other Problems Elbert Ewings, CMA; 01/19/2015 10:07 AM) Arthritis Diabetes Mellitus High blood pressure Hypercholesterolemia  Past Surgical History Elbert Ewings, CMA; 01/19/2015 10:07 AM) Breast Biopsy Left.  Diagnostic Studies History Elbert Ewings, [place]; 01/19/2015 10:07 AM) Colonoscopy never Mammogram within last year  Allergies Elbert Ewings, CMA; 01/19/2015 10:09 AM) No Known Drug Allergies04/01/2015  Medication History Elbert Ewings, CMA; 01/19/2015 10:12 AM) Fosamax (70MG  Tablet, Oral) Active. Calcium Lactate (750MG  Tablet, Oral) Active. Flexeril (10MG  Tablet, Oral) Active. Linagliptin (5MG  Tablet, Oral) Active. Tradjenta (5MG  Tablet, Oral) Active. Mobic (15MG  Tablet, Oral) Active. PriLOSEC (40MG  Capsule DR, Oral) Active. Cozaar (50MG  Tablet, Oral) Active. Medications Reconciled  Social History Elbert Ewings, [place]; 01/19/2015 10:07 AM) Caffeine use Coffee. No alcohol use No drug use Tobacco use Never smoker.  Family History Elbert Ewings, [place]; 01/19/2015 10:07 AM) Alcohol Abuse  Son.  Pregnancy / Birth History Elbert Ewings, CMA; 01/19/2015 10:08 AM) Age at menarche 9 years. Age of menopause 32-55 Gravida 101 Maternal age 72-20 Para 10 Regular periods  Review of Systems Elbert Ewings CMA; 01/19/2015 10:08 AM) General Not Present- Appetite Loss, Chills, Fatigue, Fever, Night Sweats, Weight Gain and Weight Loss. Skin Not Present- Change in Wart/Mole, Dryness, Hives, Jaundice, New Lesions, Non-Healing Wounds, Rash and Ulcer. HEENT Present- Wears glasses/contact lenses. Not Present- Earache, Hearing Loss, Hoarseness, Nose Bleed, Oral Ulcers, Ringing in the Ears, Seasonal Allergies, Sinus Pain, Sore Throat, Visual Disturbances and Yellow Eyes. Respiratory Not Present- Bloody sputum, Chronic Cough, Difficulty Breathing, Snoring and Wheezing. Breast Present- Breast Mass. Not Present- Breast Pain, Nipple Discharge and Skin Changes. Cardiovascular Not Present- Chest Pain, Difficulty Breathing Lying Down, Leg Cramps, Palpitations, Rapid Heart Rate, Shortness of Breath and Swelling of Extremities. Gastrointestinal Present- Constipation. Not Present- Abdominal Pain, Bloating, Bloody Stool, Change in Bowel Habits, Chronic diarrhea, Difficulty Swallowing, Excessive gas, Gets full quickly at meals, Hemorrhoids, Indigestion, Nausea, Rectal Pain and Vomiting. Female Genitourinary Not Present- Frequency, Nocturia, Painful Urination, Pelvic Pain and Urgency. Musculoskeletal Not Present- Back Pain, Joint Pain, Joint Stiffness, Muscle Pain, Muscle Weakness and Swelling of Extremities. Neurological Not Present- Decreased Memory, Fainting, Headaches, Numbness, Seizures, Tingling, Tremor, Trouble walking and Weakness. Psychiatric Not Present- Anxiety, Bipolar, Change in Sleep Pattern, Depression, Fearful and Frequent crying. Endocrine Not Present- Cold Intolerance, Excessive Hunger, Hair Changes, Heat Intolerance, Hot flashes and New Diabetes. Hematology Not Present- Easy Bruising, Excessive  bleeding, Gland problems, HIV and Persistent Infections.   Vitals Elbert Ewings CMA; 01/19/2015 10:13 AM) 01/19/2015 10:12 AM Weight: 142 lb Height: 62in Body Surface Area: 1.68 m Body Mass Index: 25.97 kg/m Temp.: 97.10F(Temporal)  Pulse: 85 (Regular)  Resp.: 17 (Unlabored)  BP: 126/60 (Sitting, Left Arm, Standard)  Physical Exam (Dianna Deshler A. Amanda Linden MD; 01/19/2015 10:33 AM) General Mental Status-Alert. General Appearance-Consistent with stated age. Hydration-Well hydrated. Voice-Normal.  Head and Neck Head-normocephalic, atraumatic with no lesions or palpable masses. Trachea-midline. Thyroid Gland Characteristics - normal size and consistency.  Eye Eyeball - Bilateral-Extraocular movements intact. Sclera/Conjunctiva - Bilateral-No scleral icterus.  Chest and Lung Exam Chest and lung exam reveals -quiet, even and easy respiratory effort with no use of accessory muscles and on auscultation, normal breath sounds, no adventitious sounds and normal vocal resonance. Inspection Chest Wall - Normal. Back - normal.  Breast Breast - Left-Symmetric, Mildly tender and Biopsy scar. Note: There is a palpable, soft, approximately 1-1/2 cm mass adjacent to the areola at the 1 to 2 o'clock position of the left breast. There is no axillary adenopathy palpable Breast - Right-Normal. Breast Lump-No Palpable Breast Mass.  Cardiovascular Cardiovascular examination reveals -normal heart sounds, regular rate and rhythm with no murmurs and normal pedal pulses bilaterally.  Abdomen Inspection Inspection of the abdomen reveals - No Hernias. Skin - Scar - no surgical scars. Palpation/Percussion Palpation and Percussion of the abdomen reveal - Soft, Non Tender, No Rebound tenderness, No Rigidity (guarding) and No hepatosplenomegaly. Auscultation Auscultation of the abdomen reveals - Bowel sounds normal.  Neurologic Neurologic evaluation reveals -alert  and oriented x 3 with no impairment of recent or remote memory. Mental Status-Normal.  Musculoskeletal Normal Exam - Left-Upper Extremity Strength Normal and Lower Extremity Strength Normal. Normal Exam - Right-Upper Extremity Strength Normal and Lower Extremity Strength Normal.  Lymphatic Head & Neck  General Head & Neck Lymphatics: Bilateral - Description - Normal. Axillary  General Axillary Region: Bilateral - Description - Normal. Tenderness - Non Tender. Femoral & Inguinal  Generalized Femoral & Inguinal Lymphatics: Bilateral - Description - Normal. Tenderness - Non Tender.    Assessment & Plan (Matison Nuccio A. Amanda Linden MD; 01/19/2015 10:34 AM) BREAST CANCER, LEFT (174.9  C50.912) Impression: I discussed the diagnosis with the patient and her daughter in detail. Her daughter acted as the interpreter as the patient does not speak [place]. I discussed breast conservation versus mastectomy with her in detail. I also discussed sentinel lymph node biopsy. She is definitely leaning toward lumpectomy although she may still opt for a mastectomy to forego radiation. I discussed the long-term benefits of both. We will get her referred to the cancer center to see both medical and radiation oncologist preoperatively and then she will decide which procedure she would like.  Addendum:  After a discussion with medical and radiation oncology, she wishes to proceed with a left breast lumpectomy and sentinel lymph node biopsy.  Again the risks and benefits were discussed in detail.

## 2015-02-03 ENCOUNTER — Ambulatory Visit (HOSPITAL_BASED_OUTPATIENT_CLINIC_OR_DEPARTMENT_OTHER)
Admission: RE | Admit: 2015-02-03 | Discharge: 2015-02-03 | Disposition: A | Discharge status: Home / Self Care | Payer: Medicare Other | Source: Ambulatory Visit | Attending: Surgery | Admitting: Surgery

## 2015-02-03 ENCOUNTER — Ambulatory Visit (HOSPITAL_BASED_OUTPATIENT_CLINIC_OR_DEPARTMENT_OTHER): Payer: Medicare Other | Admitting: Anesthesiology

## 2015-02-03 ENCOUNTER — Encounter (HOSPITAL_BASED_OUTPATIENT_CLINIC_OR_DEPARTMENT_OTHER): Payer: Self-pay

## 2015-02-03 ENCOUNTER — Encounter (HOSPITAL_COMMUNITY): Payer: Medicare Other

## 2015-02-03 ENCOUNTER — Encounter (HOSPITAL_BASED_OUTPATIENT_CLINIC_OR_DEPARTMENT_OTHER)
Admission: RE | Disposition: A | Discharge status: Home / Self Care | Payer: Self-pay | Source: Ambulatory Visit | Attending: Surgery

## 2015-02-03 ENCOUNTER — Encounter (HOSPITAL_COMMUNITY)
Admission: RE | Admit: 2015-02-03 | Discharge: 2015-02-03 | Disposition: A | Discharge status: Home / Self Care | Payer: Medicare Other | Source: Ambulatory Visit | Attending: Surgery | Admitting: Surgery

## 2015-02-03 ENCOUNTER — Other Ambulatory Visit: Payer: Self-pay

## 2015-02-03 DIAGNOSIS — N6012 Diffuse cystic mastopathy of left breast: Secondary | ICD-10-CM | POA: Insufficient documentation

## 2015-02-03 DIAGNOSIS — E119 Type 2 diabetes mellitus without complications: Secondary | ICD-10-CM | POA: Insufficient documentation

## 2015-02-03 DIAGNOSIS — R921 Mammographic calcification found on diagnostic imaging of breast: Secondary | ICD-10-CM | POA: Insufficient documentation

## 2015-02-03 DIAGNOSIS — Z170 Estrogen receptor positive status [ER+]: Secondary | ICD-10-CM | POA: Insufficient documentation

## 2015-02-03 DIAGNOSIS — I10 Essential (primary) hypertension: Secondary | ICD-10-CM | POA: Insufficient documentation

## 2015-02-03 DIAGNOSIS — D242 Benign neoplasm of left breast: Secondary | ICD-10-CM | POA: Diagnosis not present

## 2015-02-03 DIAGNOSIS — N6092 Unspecified benign mammary dysplasia of left breast: Secondary | ICD-10-CM | POA: Insufficient documentation

## 2015-02-03 DIAGNOSIS — K219 Gastro-esophageal reflux disease without esophagitis: Secondary | ICD-10-CM | POA: Insufficient documentation

## 2015-02-03 DIAGNOSIS — Z791 Long term (current) use of non-steroidal anti-inflammatories (NSAID): Secondary | ICD-10-CM | POA: Insufficient documentation

## 2015-02-03 DIAGNOSIS — M1990 Unspecified osteoarthritis, unspecified site: Secondary | ICD-10-CM | POA: Insufficient documentation

## 2015-02-03 DIAGNOSIS — C50912 Malignant neoplasm of unspecified site of left female breast: Secondary | ICD-10-CM

## 2015-02-03 DIAGNOSIS — Z79899 Other long term (current) drug therapy: Secondary | ICD-10-CM | POA: Insufficient documentation

## 2015-02-03 HISTORY — PX: BREAST LUMPECTOMY WITH SENTINEL LYMPH NODE BIOPSY: SHX5597

## 2015-02-03 HISTORY — DX: Anxiety disorder, unspecified: F41.9

## 2015-02-03 HISTORY — DX: Malignant (primary) neoplasm, unspecified: C80.1

## 2015-02-03 LAB — POCT HEMOGLOBIN-HEMACUE
HEMOGLOBIN: 12.8 g/dL (ref 12.0–15.0)
Hemoglobin: 12.7 g/dL (ref 12.0–15.0)

## 2015-02-03 LAB — GLUCOSE, CAPILLARY: Glucose-Capillary: 174 mg/dL — ABNORMAL HIGH (ref 70–99)

## 2015-02-03 SURGERY — BREAST LUMPECTOMY WITH SENTINEL LYMPH NODE BX
Anesthesia: General | Laterality: Left

## 2015-02-03 MED ORDER — METHYLENE BLUE 1 % INJ SOLN
INTRAMUSCULAR | Status: AC
  Filled 2015-02-03: qty 10

## 2015-02-03 MED ORDER — LIDOCAINE HCL (CARDIAC) 20 MG/ML IV SOLN
INTRAVENOUS | Status: DC | PRN
  Administered 2015-02-03: 40 mg via INTRAVENOUS

## 2015-02-03 MED ORDER — MEPERIDINE HCL 25 MG/ML IJ SOLN
6.2500 mg | INTRAMUSCULAR | Status: DC | PRN
Start: 2015-02-03 — End: 2015-02-03

## 2015-02-03 MED ORDER — SODIUM CHLORIDE 0.9 % IJ SOLN
INTRAMUSCULAR | Status: DC | PRN
  Administered 2015-02-03: 3 mL

## 2015-02-03 MED ORDER — KETOROLAC TROMETHAMINE 15 MG/ML IJ SOLN
INTRAMUSCULAR | Status: DC | PRN
  Administered 2015-02-03: 15 mg via INTRAVENOUS

## 2015-02-03 MED ORDER — TECHNETIUM TC 99M SULFUR COLLOID FILTERED: 1.0000 | Freq: Once | INTRAVENOUS | Status: AC | PRN

## 2015-02-03 MED ORDER — FENTANYL CITRATE (PF) 100 MCG/2ML IJ SOLN
100.0000 ug | Freq: Once | INTRAMUSCULAR | Status: AC
  Administered 2015-02-03: 100 ug via INTRAVENOUS

## 2015-02-03 MED ORDER — CEFAZOLIN SODIUM-DEXTROSE 2-3 GM-% IV SOLR
INTRAVENOUS | Status: AC
  Filled 2015-02-03: qty 50

## 2015-02-03 MED ORDER — CEFAZOLIN SODIUM-DEXTROSE 2-3 GM-% IV SOLR
2.0000 g | INTRAVENOUS | Status: AC
  Administered 2015-02-03: 2 g via INTRAVENOUS

## 2015-02-03 MED ORDER — KETOROLAC TROMETHAMINE 30 MG/ML IJ SOLN: 30.0000 mg | Freq: Once | INTRAMUSCULAR | Status: DC | PRN

## 2015-02-03 MED ORDER — DEXAMETHASONE SODIUM PHOSPHATE 4 MG/ML IJ SOLN
INTRAMUSCULAR | Status: DC | PRN
  Administered 2015-02-03: 8 mg via INTRAVENOUS

## 2015-02-03 MED ORDER — MIDAZOLAM HCL 2 MG/2ML IJ SOLN
INTRAMUSCULAR | Status: AC
  Filled 2015-02-03: qty 2

## 2015-02-03 MED ORDER — FENTANYL CITRATE (PF) 100 MCG/2ML IJ SOLN
INTRAMUSCULAR | Status: DC | PRN
  Administered 2015-02-03: 100 ug via INTRAVENOUS
  Administered 2015-02-03: 50 ug via INTRAVENOUS

## 2015-02-03 MED ORDER — FENTANYL CITRATE (PF) 100 MCG/2ML IJ SOLN
INTRAMUSCULAR | Status: AC
  Filled 2015-02-03: qty 6

## 2015-02-03 MED ORDER — HYDROMORPHONE HCL 1 MG/ML IJ SOLN
INTRAMUSCULAR | Status: AC
  Filled 2015-02-03: qty 1

## 2015-02-03 MED ORDER — HYDROCODONE-ACETAMINOPHEN 5-325 MG PO TABS: 1.0000 | ORAL_TABLET | ORAL | Status: DC | PRN

## 2015-02-03 MED ORDER — FENTANYL CITRATE (PF) 100 MCG/2ML IJ SOLN
INTRAMUSCULAR | Status: AC
  Filled 2015-02-03: qty 2

## 2015-02-03 MED ORDER — IBUPROFEN 100 MG/5ML PO SUSP: 200.0000 mg | Freq: Four times a day (QID) | ORAL | Status: DC | PRN

## 2015-02-03 MED ORDER — MIDAZOLAM HCL 2 MG/2ML IJ SOLN
1.0000 mg | INTRAMUSCULAR | Status: DC | PRN
  Administered 2015-02-03: 1 mg via INTRAVENOUS

## 2015-02-03 MED ORDER — HYDROMORPHONE HCL 1 MG/ML IJ SOLN
0.2500 mg | INTRAMUSCULAR | Status: DC | PRN
  Administered 2015-02-03: 0.25 mg via INTRAVENOUS

## 2015-02-03 MED ORDER — SODIUM CHLORIDE 0.9 % IJ SOLN
INTRAMUSCULAR | Status: AC
  Filled 2015-02-03: qty 10

## 2015-02-03 MED ORDER — BUPIVACAINE-EPINEPHRINE (PF) 0.5% -1:200000 IJ SOLN
INTRAMUSCULAR | Status: AC
  Filled 2015-02-03: qty 30

## 2015-02-03 MED ORDER — LACTATED RINGERS IV SOLN
INTRAVENOUS | Status: DC
  Administered 2015-02-03 (×2): via INTRAVENOUS

## 2015-02-03 MED ORDER — BUPIVACAINE-EPINEPHRINE 0.5% -1:200000 IJ SOLN
INTRAMUSCULAR | Status: DC | PRN
  Administered 2015-02-03: 10 mL

## 2015-02-03 MED ORDER — PROPOFOL 10 MG/ML IV BOLUS
INTRAVENOUS | Status: DC | PRN
  Administered 2015-02-03: 150 mg via INTRAVENOUS

## 2015-02-03 MED ORDER — IBUPROFEN 200 MG PO TABS: 200.0000 mg | ORAL_TABLET | Freq: Four times a day (QID) | ORAL | Status: DC | PRN

## 2015-02-03 SURGICAL SUPPLY — 60 items
APPLIER CLIP 9.375 MED OPEN (MISCELLANEOUS) ×3
BINDER BREAST MEDIUM (GAUZE/BANDAGES/DRESSINGS) ×3 IMPLANT
BLADE CLIPPER SURG (BLADE) IMPLANT
BLADE HEX COATED 2.75 (ELECTRODE) ×3 IMPLANT
BLADE SURG 15 STRL LF DISP TIS (BLADE) ×2 IMPLANT
BLADE SURG 15 STRL SS (BLADE) ×4
CANISTER SUCT 1200ML W/VALVE (MISCELLANEOUS) ×3 IMPLANT
CHLORAPREP W/TINT 26ML (MISCELLANEOUS) ×3 IMPLANT
CLIP APPLIE 9.375 MED OPEN (MISCELLANEOUS) ×1 IMPLANT
CLOSURE WOUND 1/2 X4 (GAUZE/BANDAGES/DRESSINGS) ×1
COVER BACK TABLE 60X90IN (DRAPES) ×6 IMPLANT
COVER MAYO STAND STRL (DRAPES) ×6 IMPLANT
COVER PROBE W GEL 5X96 (DRAPES) ×3 IMPLANT
DECANTER SPIKE VIAL GLASS SM (MISCELLANEOUS) IMPLANT
DEVICE DUBIN W/COMP PLATE 8390 (MISCELLANEOUS) IMPLANT
DRAIN CHANNEL 19F RND (DRAIN) IMPLANT
DRAIN HEMOVAC 1/8 X 5 (WOUND CARE) IMPLANT
DRAPE LAPAROSCOPIC ABDOMINAL (DRAPES) ×3 IMPLANT
DRAPE UTILITY XL STRL (DRAPES) ×6 IMPLANT
DRSG TEGADERM 4X4.75 (GAUZE/BANDAGES/DRESSINGS) ×12 IMPLANT
ELECT REM PT RETURN 9FT ADLT (ELECTROSURGICAL) ×3
ELECTRODE REM PT RTRN 9FT ADLT (ELECTROSURGICAL) ×1 IMPLANT
EVACUATOR SILICONE 100CC (DRAIN) IMPLANT
GAUZE SPONGE 4X4 12PLY STRL (GAUZE/BANDAGES/DRESSINGS) ×3 IMPLANT
GLOVE BIO SURGEON STRL SZ 6.5 (GLOVE) ×2 IMPLANT
GLOVE BIO SURGEON STRL SZ7 (GLOVE) ×3 IMPLANT
GLOVE BIO SURGEONS STRL SZ 6.5 (GLOVE) ×1
GLOVE BIOGEL PI IND STRL 7.5 (GLOVE) ×1 IMPLANT
GLOVE BIOGEL PI INDICATOR 7.5 (GLOVE) ×2
GLOVE SURG SIGNA 7.5 PF LTX (GLOVE) ×3 IMPLANT
GOWN STRL REUS W/ TWL LRG LVL3 (GOWN DISPOSABLE) ×1 IMPLANT
GOWN STRL REUS W/ TWL XL LVL3 (GOWN DISPOSABLE) ×1 IMPLANT
GOWN STRL REUS W/TWL LRG LVL3 (GOWN DISPOSABLE) ×2
GOWN STRL REUS W/TWL XL LVL3 (GOWN DISPOSABLE) ×2
HEMOSTAT SURGICEL 2X14 (HEMOSTASIS) IMPLANT
KIT MARKER MARGIN INK (KITS) ×3 IMPLANT
LIQUID BAND (GAUZE/BANDAGES/DRESSINGS) ×3 IMPLANT
NDL SAFETY ECLIPSE 18X1.5 (NEEDLE) ×1 IMPLANT
NEEDLE HYPO 18GX1.5 SHARP (NEEDLE) ×2
NEEDLE HYPO 25X1 1.5 SAFETY (NEEDLE) ×6 IMPLANT
NS IRRIG 1000ML POUR BTL (IV SOLUTION) ×3 IMPLANT
PACK BASIN DAY SURGERY FS (CUSTOM PROCEDURE TRAY) ×3 IMPLANT
PENCIL BUTTON HOLSTER BLD 10FT (ELECTRODE) ×3 IMPLANT
PIN SAFETY STERILE (MISCELLANEOUS) IMPLANT
SLEEVE SCD COMPRESS KNEE MED (MISCELLANEOUS) ×3 IMPLANT
SPONGE GAUZE 4X4 12PLY STER LF (GAUZE/BANDAGES/DRESSINGS) IMPLANT
SPONGE LAP 18X18 X RAY DECT (DISPOSABLE) IMPLANT
SPONGE LAP 4X18 X RAY DECT (DISPOSABLE) ×3 IMPLANT
STRIP CLOSURE SKIN 1/2X4 (GAUZE/BANDAGES/DRESSINGS) ×2 IMPLANT
SUT ETHILON 3 0 PS 1 (SUTURE) IMPLANT
SUT MNCRL AB 4-0 PS2 18 (SUTURE) ×6 IMPLANT
SUT VIC AB 3-0 SH 27 (SUTURE) ×2
SUT VIC AB 3-0 SH 27X BRD (SUTURE) ×1 IMPLANT
SYR BULB 3OZ (MISCELLANEOUS) ×3 IMPLANT
SYR CONTROL 10ML LL (SYRINGE) ×6 IMPLANT
TOWEL OR 17X24 6PK STRL BLUE (TOWEL DISPOSABLE) ×3 IMPLANT
TOWEL OR NON WOVEN STRL DISP B (DISPOSABLE) ×3 IMPLANT
TUBE CONNECTING 20'X1/4 (TUBING) ×1
TUBE CONNECTING 20X1/4 (TUBING) ×2 IMPLANT
YANKAUER SUCT BULB TIP NO VENT (SUCTIONS) ×3 IMPLANT

## 2015-02-03 NOTE — Op Note (Signed)
LEFT BREAST LUMPECTOMY WITH SENTINEL LYMPH NODE BX  Procedure Note  Amanda Schneider 02/03/2015   Pre-op Diagnosis: Left Breast Cancer     Post-op Diagnosis: same  Procedure(s): LEFT BREAST PARTIAL MASTECTOMY WITH SENTINEL LYMPH NODE BX INJECTION OF BLUE DYE  Surgeon(s): Coralie Keens, MD  Anesthesia: General  Staff:  Circulator: Vara Guardian, RN Relief Circulator: Burt Ek, RN Scrub Person: Maurene Capes, RN  Estimated Blood Loss: Minimal               Specimens: SENT TO PATH          Coralie Keens A   Date: 02/03/2015  Time: 10:30 AM

## 2015-02-03 NOTE — Discharge Instructions (Signed)
[facility] °Office Phone Number [phone] ° °BREAST BIOPSY/ LUMPECTOMY: POST OP INSTRUCTIONS ° °Always review your discharge instruction sheet given to you by the facility where your surgery was performed. ° °IF YOU HAVE DISABILITY OR FAMILY LEAVE FORMS, YOU MUST BRING THEM TO THE OFFICE FOR PROCESSING.  DO NOT GIVE THEM TO YOUR DOCTOR. ° °1. A prescription for pain medication may be given to you upon discharge.  Take your pain medication as prescribed, if needed.  If narcotic pain medicine is not needed, then you may take acetaminophen (Tylenol) or ibuprofen (Advil) as needed. °2. Take your usually prescribed medications unless otherwise directed °3. If you need a refill on your pain medication, please contact your pharmacy.  They will contact our office to request authorization.  Prescriptions will not be filled after 5pm or on week-ends. °4. You should eat very light the first 24 hours after surgery, such as soup, crackers, pudding, etc.  Resume your normal diet the day after surgery. °5. Most patients will experience some swelling and bruising in the breast.  Ice packs and a good support bra will help.  Swelling and bruising can take several days to resolve.  °6. It is common to experience some constipation if taking pain medication after surgery.  Increasing fluid intake and taking a stool softener will usually help or prevent this problem from occurring.  A mild laxative (Milk of Magnesia or Miralax) should be taken according to package directions if there are no bowel movements after 48 hours. °7. Unless discharge instructions indicate otherwise, you may remove your bandages 24-48 hours after surgery, and you may shower at that time.  You may have steri-strips (small skin tapes) in place directly over the incision.  These strips should be left on the skin for 7-10 days.  If your surgeon used skin glue on the incision, you may shower in 24 hours.  The glue will flake off over the next 2-3  weeks.  Any sutures or staples will be removed at the office during your follow-up visit. °8. ACTIVITIES:  You may resume regular daily activities (gradually increasing) beginning the next day.  Wearing a good support bra or sports bra minimizes pain and swelling.  You may have sexual intercourse when it is comfortable. °a. You may drive when you no longer are taking prescription pain medication, you can comfortably wear a seatbelt, and you can safely maneuver your car and apply brakes. °b. RETURN TO WORK:  ______________________________________________________________________________________ °9. You should see your doctor in the office for a follow-up appointment approximately two weeks after your surgery.  Your doctor’s nurse will typically make your follow-up appointment when she calls you with your pathology report.  Expect your pathology report 2-3 business days after your surgery.  You may call to check if you do not hear from us after three days. °10. OTHER INSTRUCTIONS: _______________________________________________________________________________________________ _____________________________________________________________________________________________________________________________________ °_____________________________________________________________________________________________________________________________________ °_____________________________________________________________________________________________________________________________________ ° °WHEN TO CALL YOUR DOCTOR: °1. Fever over 101.0 °2. Nausea and/or vomiting. °3. Extreme swelling or bruising. °4. Continued bleeding from incision. °5. Increased pain, redness, or drainage from the incision. ° °The clinic staff is available to answer your questions during regular business hours.  Please don’t hesitate to call and ask to speak to one of the nurses for clinical concerns.  If you have a medical emergency, go to the nearest emergency  room or call 911.  A surgeon from [facility] is always on call at the hospital. ° °For further questions, please visit [email or web address]  ° °  Post Anesthesia Home Care Instructions ° °Activity: °Get plenty of rest for the remainder of the day. A responsible adult should stay with you for 24 hours following the procedure.  °For the next 24 hours, DO NOT: °-Drive a car °-Operate machinery °-Drink alcoholic beverages °-Take any medication unless instructed by your physician °-Make any legal decisions or sign important papers. ° °Meals: °Start with liquid foods such as gelatin or soup. Progress to regular foods as tolerated. Avoid greasy, spicy, heavy foods. If nausea and/or vomiting occur, drink only clear liquids until the nausea and/or vomiting subsides. Call your physician if vomiting continues. ° °Special Instructions/Symptoms: °Your throat may feel dry or sore from the anesthesia or the breathing tube placed in your throat during surgery. If this causes discomfort, gargle with warm salt water. The discomfort should disappear within 24 hours. ° °If you had a scopolamine patch placed behind your ear for the management of post- operative nausea and/or vomiting: ° °1. The medication in the patch is effective for 72 hours, after which it should be removed.  Wrap patch in a tissue and discard in the trash. Wash hands thoroughly with soap and water. °2. You may remove the patch earlier than 72 hours if you experience unpleasant side effects which may include dry mouth, dizziness or visual disturbances. °3. Avoid touching the patch. Wash your hands with soap and water after contact with the patch. °  ° °

## 2015-02-03 NOTE — Anesthesia Procedure Notes (Signed)
Procedure Name: LMA Insertion Date/Time: 02/03/2015 9:48 AM Performed by: Melynda Ripple D Pre-anesthesia Checklist: Patient identified, Emergency Drugs available, Suction available and Patient being monitored Patient Re-evaluated:Patient Re-evaluated prior to inductionOxygen Delivery Method: Circle System Utilized Preoxygenation: Pre-oxygenation with 100% oxygen Intubation Type: IV induction Ventilation: Mask ventilation without difficulty LMA: LMA inserted LMA Size: 4.0 Number of attempts: 1 Airway Equipment and Method: Bite block Placement Confirmation: positive ETCO2 Tube secured with: Tape Dental Injury: Teeth and Oropharynx as per pre-operative assessment

## 2015-02-03 NOTE — Transfer of Care (Signed)
Immediate Anesthesia Transfer of Care Note  Patient: Amanda Schneider  Procedure(s) Performed: Procedure(s): LEFT BREAST LUMPECTOMY WITH SENTINEL LYMPH NODE BX (Left)  Patient Location: PACU  Anesthesia Type:General  Level of Consciousness: awake, alert  and oriented  Airway & Oxygen Therapy: Patient Spontanous Breathing and Patient connected to face mask oxygen  Post-op Assessment: Report given to RN and Post -op Vital signs reviewed and stable  Post vital signs: Reviewed and stable  Last Vitals:  Filed Vitals:   02/03/15 0940  BP: 170/75  Pulse: 113  Temp:   Resp: 22    Complications: No apparent anesthesia complications

## 2015-02-03 NOTE — Anesthesia Postprocedure Evaluation (Signed)
  Anesthesia Post-op Note  Patient: Amanda Schneider  Procedure(s) Performed: Procedure(s): LEFT BREAST LUMPECTOMY WITH SENTINEL LYMPH NODE BX (Left)  Patient Location: PACU  Anesthesia Type: General with regional block for post op pain   Level of Consciousness: awake, alert  and oriented  Airway and Oxygen Therapy: Patient Spontanous Breathing  Post-op Pain: mild  Post-op Assessment: Post-op Vital signs reviewed  Post-op Vital Signs: Reviewed  Last Vitals:  Filed Vitals:   02/03/15 1131  BP:   Pulse: 94  Temp: 36.6 C  Resp: 18    Complications: No apparent anesthesia complications

## 2015-02-03 NOTE — Anesthesia Preprocedure Evaluation (Signed)
Anesthesia Evaluation  Patient identified by MRN, date of birth, ID band Patient awake    Reviewed: Allergy & Precautions, NPO status , Patient's Chart, lab work & pertinent test results  Airway Mallampati: I  TM Distance: >3 FB Neck ROM: Full    Dental  (+) Partial Upper, Partial Lower, Dental Advisory Given   Pulmonary  breath sounds clear to auscultation        Cardiovascular hypertension, Pt. on medications Rhythm:Regular Rate:Normal     Neuro/Psych    GI/Hepatic GERD-  Medicated and Controlled,  Endo/Other  diabetes, Well Controlled, Type 2, Oral Hypoglycemic Agents  Renal/GU      Musculoskeletal   Abdominal   Peds  Hematology   Anesthesia Other Findings   Reproductive/Obstetrics                             Anesthesia Physical Anesthesia Plan  ASA: III  Anesthesia Plan: General   Post-op Pain Management:    Induction: Intravenous  Airway Management Planned: LMA  Additional Equipment:   Intra-op Plan:   Post-operative Plan: Extubation in OR  Informed Consent: I have reviewed the patients History and Physical, chart, labs and discussed the procedure including the risks, benefits and alternatives for the proposed anesthesia with the patient or authorized representative who has indicated his/her understanding and acceptance.   Dental advisory given  Plan Discussed with: CRNA, Anesthesiologist and Surgeon  Anesthesia Plan Comments:         Anesthesia Quick Evaluation

## 2015-02-03 NOTE — Interval H&P Note (Signed)
History and Physical Interval Note:no change in H and P  02/03/2015 9:06 AM  Amanda Schneider  has presented today for surgery, with the diagnosis of Left Breast Cancer  The various methods of treatment have been discussed with the patient and family. After consideration of risks, benefits and other options for treatment, the patient has consented to  Procedure(s): LEFT BREAST LUMPECTOMY WITH SENTINEL LYMPH NODE BX (Left) as a surgical intervention .  The patient's history has been reviewed, patient examined, no change in status, stable for surgery.  I have reviewed the patient's chart and labs.  Questions were answered to the patient's satisfaction.     Carlin Mamone A

## 2015-02-04 ENCOUNTER — Telehealth: Payer: Self-pay | Admitting: Hematology and Oncology

## 2015-02-04 ENCOUNTER — Encounter (HOSPITAL_BASED_OUTPATIENT_CLINIC_OR_DEPARTMENT_OTHER): Payer: Self-pay | Admitting: Surgery

## 2015-02-04 NOTE — Telephone Encounter (Signed)
Mailed appointment letter and calendar to patient

## 2015-02-04 NOTE — Op Note (Signed)
Amanda Schneider, Amanda Schneider               ACCOUNT NO.:  [id number]  MEDICAL RECORD NO.:  [id number]  LOCATION:  [facility]                          FACILITY:  [facility]  PHYSICIAN:  Coralie Keens, M.D. DATE OF BIRTH:  07-28-1935  DATE OF PROCEDURE:  02/03/2015 DATE OF DISCHARGE:                              OPERATIVE REPORT   PREOPERATIVE DIAGNOSIS:  Left breast cancer.  POSTOPERATIVE DIAGNOSIS:  Left breast cancer.  PROCEDURE: 1. Left breast partial mastectomy. 2. Left axillary sentinel lymph node biopsy. 3. Injection of blue dye.  SURGEON:  Coralie Keens, MD  ANESTHESIA:  General with pec block and 0.5% Marcaine.  ESTIMATED BLOOD LOSS:  Minimal.  INDICATIONS:  This is a 80 year old female who presented with a palpable mass in her left breast.  Mammogram was unremarkable.  Ultrasound demonstrated a 1.2 cm mass, which was suspicious for malignancy.  A biopsy was performed which did show cancer.  After a long discussion regarding lumpectomy and sentinel node biopsy versus mastectomy with sentinel node biopsy, she wished to proceed with breast conservation.  FINDINGS:  The patient was found to have 2 sentinel lymph nodes identified in the axilla.  PROCEDURE IN DETAIL:  The patient was identified in the holding area and identified as Amanda Schneider.  Radioactive isotope was then injected around the areola of the left breast and a pectoralis nerve block was performed by Anesthesia.  She was then taken to the operating room, placed supine on the operating room table and general anesthesia was induced.  I then injected blue dye underneath the areola of her left breast and massaged the breast.  Her left breast and axilla were then prepped and draped in usual sterile fashion.  The palpable mass was at the 1 to 2 o'clock position of the left breast underneath the areola.  I made an elliptical incision at the lateral edge of the areola including the overlying skin.  I took this down to the  breast tissue with electrocautery.  I then undermined the lateral portion of the areola going underneath the nipple and then took this down to the chest wall. I performed a left breast partial mastectomy excising the mass and surrounding tissue with the electrocautery.  Once the mass was completely removed, I marked it with marker paint and sent to Pathology for evaluation.  The Neoprobe was then brought into the field.  I identified an area of increased uptake in the left axilla.  I then made an incision with a scalpel and took this down to the axillary tissue with electrocautery.  With the aid of the Neoprobe, I was able to identify 2 sentinel lymph nodes.  There was no blue dye in the lymph nodes.  These lymph nodes were excised in their entirety with the cautery and sent to Pathology for evaluation.  I then examined the axilla again and found no other increased uptake.  There were also no palpable nodes on further examination.  At this point, I anesthetized both wounds with Marcaine.  I placed surgical clips around the margins of the partial mastectomy site.  I then closed the subcutaneous tissue of both incisions with interrupted 3-0 Vicryl sutures and closed both skin incisions  with 4-0 Monocryl.  Dermabond and a binder were then applied.  The patient tolerated the procedure well.  All the counts were correct at the end of the procedure.  The patient was then extubated in the operating room and taken in stable condition to the recovery room.     Coralie Keens, M.D.     DB/MEDQ  D:  02/03/2015  T:  02/04/2015  Job:  [id number]

## 2015-02-06 ENCOUNTER — Other Ambulatory Visit: Payer: Self-pay | Admitting: Surgery

## 2015-02-13 ENCOUNTER — Telehealth: Payer: Self-pay | Admitting: *Deleted

## 2015-02-13 NOTE — Telephone Encounter (Signed)
Oncotype testing received from Dr. Lindi Adie. Requisition sent to pathology. Received by Alyse Low.

## 2015-02-25 ENCOUNTER — Encounter (HOSPITAL_BASED_OUTPATIENT_CLINIC_OR_DEPARTMENT_OTHER): Payer: Self-pay | Admitting: *Deleted

## 2015-02-25 NOTE — Progress Notes (Signed)
Patient scheduled for re-excision left breast lumpectomy site 03-02-15. She speaks broken [place] but her daughter Earnest Rosier lives with her and came with her on her last surgery on 02-03-15. Per daughter and pt, they req no Spanish interpreter as daughter speaks [place].

## 2015-02-26 ENCOUNTER — Encounter (HOSPITAL_COMMUNITY): Payer: Self-pay

## 2015-02-26 ENCOUNTER — Telehealth: Payer: Self-pay | Admitting: *Deleted

## 2015-02-26 ENCOUNTER — Telehealth: Payer: Self-pay | Admitting: Hematology and Oncology

## 2015-02-26 NOTE — Telephone Encounter (Signed)
Left message to confirm appointment change from May to July. Mailed calendar.

## 2015-02-26 NOTE — Telephone Encounter (Signed)
Received oncotype score of 0/3%. POF entered to cancel Dr. Lindi Adie appt. Santiago Glad notified to schedule f/u appt with Dr. Pablo Ledger.

## 2015-03-01 NOTE — H&P (Addendum)
Amanda Schneider  Location: [facility] Patient #: [id number] DOB: [birth year] Widowed / Language: Spanish / Race: Undefined Female  History of Present Illness ( She is s/p excision of a left breast cancer.  There was a positive margin so we will be proceeding back to the OR for re-excision   Other Problems  Arthritis Diabetes Mellitus High blood pressure Hypercholesterolemia  Past Surgical History  Breast Biopsy Left.  Diagnostic Studies History Colonoscopy never Mammogram within last year  Allergies Elbert Ewings,  No Known Drug Allergies04/01/2015  Medication History Elbert Ewings,  Fosamax (70MG  Tablet, Oral) Active. Calcium Lactate (750MG  Tablet, Oral) Active. Flexeril (10MG  Tablet, Oral) Active. Linagliptin (5MG  Tablet, Oral) Active. Tradjenta (5MG  Tablet, Oral) Active. Mobic (15MG  Tablet, Oral) Active. PriLOSEC (40MG  Capsule DR, Oral) Active. Cozaar (50MG  Tablet, Oral) Active. Medications Reconciled  Social History Elbert Ewings, [place]; Caffeine use Coffee. No alcohol use No drug use Tobacco use Never smoker.  Family History Elbert Ewings, [place];  Alcohol Abuse Son.  Pregnancy / Birth History Elbert Ewings, [place];  Age at menarche 52 years. Age of menopause 81-55 Gravida 27 Maternal age 12-20 Para 10 Regular periods  Review of Systems Elbert Ewings CMA;  General Not Present- Appetite Loss, Chills, Fatigue, Fever, Night Sweats, Weight Gain and Weight Loss. Skin Not Present- Change in Wart/Mole, Dryness, Hives, Jaundice, New Lesions, Non-Healing Wounds, Rash and Ulcer. HEENT Present- Wears glasses/contact lenses. Not Present- Earache, Hearing Loss, Hoarseness, Nose Bleed, Oral Ulcers, Ringing in the Ears, Seasonal Allergies, Sinus Pain, Sore Throat, Visual Disturbances and Yellow Eyes. Respiratory Not Present- Bloody sputum, Chronic Cough, Difficulty Breathing, Snoring and Wheezing. Breast Present- Breast Mass. Not Present-  Breast Pain, Nipple Discharge and Skin Changes. Cardiovascular Not Present- Chest Pain, Difficulty Breathing Lying Down, Leg Cramps, Palpitations, Rapid Heart Rate, Shortness of Breath and Swelling of Extremities. Gastrointestinal Present- Constipation. Not Present- Abdominal Pain, Bloating, Bloody Stool, Change in Bowel Habits, Chronic diarrhea, Difficulty Swallowing, Excessive gas, Gets full quickly at meals, Hemorrhoids, Indigestion, Nausea, Rectal Pain and Vomiting. Female Genitourinary Not Present- Frequency, Nocturia, Painful Urination, Pelvic Pain and Urgency. Musculoskeletal Not Present- Back Pain, Joint Pain, Joint Stiffness, Muscle Pain, Muscle Weakness and Swelling of Extremities. Neurological Not Present- Decreased Memory, Fainting, Headaches, Numbness, Seizures, Tingling, Tremor, Trouble walking and Weakness. Psychiatric Not Present- Anxiety, Bipolar, Change in Sleep Pattern, Depression, Fearful and Frequent crying. Endocrine Not Present- Cold Intolerance, Excessive Hunger, Hair Changes, Heat Intolerance, Hot flashes and New Diabetes. Hematology Not Present- Easy Bruising, Excessive bleeding, Gland problems, HIV and Persistent Infections.   Vitals Elbert Ewings CMA;  01/19/2015 10:12 AM Weight: 142 lb Height: 62in Body Surface Area: 1.68 m Body Mass Index: 25.97 kg/m Temp.: 97.61F(Temporal)  Pulse: 85 (Regular)  Resp.: 17 (Unlabored)  BP: 126/60 (Sitting, Left Arm, Standard)    Physical Exam (Prestina Raigoza A. [name] MD;  General Mental Status-Alert. General Appearance-Consistent with stated age. Hydration-Well hydrated. Voice-Normal.  Head and Neck Head-normocephalic, atraumatic with no lesions or palpable masses. Trachea-midline. Thyroid Gland Characteristics - normal size and consistency.  Eye Eyeball - Bilateral-Extraocular movements intact. Sclera/Conjunctiva - Bilateral-No scleral icterus.  Chest and Lung Exam Chest and lung exam  reveals -quiet, even and easy respiratory effort with no use of accessory muscles and on auscultation, normal breath sounds, no adventitious sounds and normal vocal resonance. Inspection Chest Wall - Normal. Back - normal.  Breast Left breast lumpectomy site healing well  Cardiovascular Cardiovascular examination reveals -normal heart sounds, regular rate and rhythm with no murmurs and normal pedal pulses  bilaterally.  Abdomen Inspection Inspection of the abdomen reveals - No Hernias. Skin - Scar - no surgical scars. Palpation/Percussion Palpation and Percussion of the abdomen reveal - Soft, Non Tender, No Rebound tenderness, No Rigidity (guarding) and No hepatosplenomegaly. Auscultation Auscultation of the abdomen reveals - Bowel sounds normal.  Neurologic Neurologic evaluation reveals -alert and oriented x 3 with no impairment of recent or remote memory. Mental Status-Normal.  Musculoskeletal Normal Exam - Left-Upper Extremity Strength Normal and Lower Extremity Strength Normal. Normal Exam - Right-Upper Extremity Strength Normal and Lower Extremity Strength Normal.  Lymphatic Head & Neck  General Head & Neck Lymphatics: Bilateral - Description - Normal. Axillary  General Axillary Region: Bilateral - Description - Normal. Tenderness - Non Tender. Femoral & Inguinal  Generalized Femoral & Inguinal Lymphatics: Bilateral - Description - Normal. Tenderness - Non Tender.    Assessment & Plan (Razi Hickle A. [name] MD; BREAST CANCER, LEFT (174.9  C50.912)  Given positive margins, will proceed back to the OR for reexcision of the left breast lumpectomy site to achieve negative margins.  Risks were discussed and she wishes to proceed.

## 2015-03-02 ENCOUNTER — Ambulatory Visit (HOSPITAL_BASED_OUTPATIENT_CLINIC_OR_DEPARTMENT_OTHER): Payer: Medicare Other | Admitting: Anesthesiology

## 2015-03-02 ENCOUNTER — Ambulatory Visit (HOSPITAL_BASED_OUTPATIENT_CLINIC_OR_DEPARTMENT_OTHER)
Admission: RE | Admit: 2015-03-02 | Discharge: 2015-03-02 | Disposition: A | Discharge status: Home / Self Care | Payer: Medicare Other | Source: Ambulatory Visit | Attending: Surgery | Admitting: Surgery

## 2015-03-02 ENCOUNTER — Encounter (HOSPITAL_BASED_OUTPATIENT_CLINIC_OR_DEPARTMENT_OTHER): Payer: Self-pay | Admitting: *Deleted

## 2015-03-02 ENCOUNTER — Encounter (HOSPITAL_BASED_OUTPATIENT_CLINIC_OR_DEPARTMENT_OTHER)
Admission: RE | Disposition: A | Discharge status: Home / Self Care | Payer: Self-pay | Source: Ambulatory Visit | Attending: Surgery

## 2015-03-02 DIAGNOSIS — Z79899 Other long term (current) drug therapy: Secondary | ICD-10-CM | POA: Insufficient documentation

## 2015-03-02 DIAGNOSIS — M1990 Unspecified osteoarthritis, unspecified site: Secondary | ICD-10-CM | POA: Diagnosis not present

## 2015-03-02 DIAGNOSIS — E119 Type 2 diabetes mellitus without complications: Secondary | ICD-10-CM | POA: Insufficient documentation

## 2015-03-02 DIAGNOSIS — E780 Pure hypercholesterolemia: Secondary | ICD-10-CM | POA: Diagnosis not present

## 2015-03-02 DIAGNOSIS — C50912 Malignant neoplasm of unspecified site of left female breast: Secondary | ICD-10-CM | POA: Insufficient documentation

## 2015-03-02 DIAGNOSIS — I10 Essential (primary) hypertension: Secondary | ICD-10-CM | POA: Diagnosis not present

## 2015-03-02 HISTORY — PX: RE-EXCISION OF BREAST LUMPECTOMY: SHX6048

## 2015-03-02 LAB — GLUCOSE, CAPILLARY
Glucose-Capillary: 151 mg/dL — ABNORMAL HIGH (ref 65–99)
Glucose-Capillary: 140 mg/dL — ABNORMAL HIGH (ref 65–99)

## 2015-03-02 LAB — POCT HEMOGLOBIN-HEMACUE: HEMOGLOBIN: 13 g/dL (ref 12.0–15.0)

## 2015-03-02 SURGERY — EXCISION, LESION, BREAST
Anesthesia: General | Site: Breast | Laterality: Left

## 2015-03-02 MED ORDER — LACTATED RINGERS IV SOLN
INTRAVENOUS | Status: DC
  Administered 2015-03-02: 10:00:00 via INTRAVENOUS

## 2015-03-02 MED ORDER — OXYCODONE HCL 5 MG PO TABS
ORAL_TABLET | ORAL | Status: AC
  Filled 2015-03-02: qty 1

## 2015-03-02 MED ORDER — OXYCODONE HCL 5 MG PO TABS
5.0000 mg | ORAL_TABLET | Freq: Once | ORAL | Status: AC
  Administered 2015-03-02: 5 mg via ORAL

## 2015-03-02 MED ORDER — DEXAMETHASONE SODIUM PHOSPHATE 4 MG/ML IJ SOLN
INTRAMUSCULAR | Status: DC | PRN
  Administered 2015-03-02: 4 mg via INTRAVENOUS

## 2015-03-02 MED ORDER — CEFAZOLIN SODIUM-DEXTROSE 2-3 GM-% IV SOLR
2.0000 g | INTRAVENOUS | Status: AC
  Administered 2015-03-02: 2 g via INTRAVENOUS

## 2015-03-02 MED ORDER — LIDOCAINE HCL (CARDIAC) 20 MG/ML IV SOLN
INTRAVENOUS | Status: DC | PRN
  Administered 2015-03-02: 50 mg via INTRAVENOUS

## 2015-03-02 MED ORDER — ACETAMINOPHEN 325 MG PO TABS: 650.0000 mg | ORAL_TABLET | ORAL | Status: DC | PRN

## 2015-03-02 MED ORDER — MIDAZOLAM HCL 2 MG/2ML IJ SOLN
1.0000 mg | INTRAMUSCULAR | Status: DC | PRN
Start: 2015-03-02 — End: 2015-03-02

## 2015-03-02 MED ORDER — MIDAZOLAM HCL 2 MG/2ML IJ SOLN
INTRAMUSCULAR | Status: AC
  Filled 2015-03-02: qty 2

## 2015-03-02 MED ORDER — SODIUM CHLORIDE 0.9 % IV SOLN
250.0000 mL | INTRAVENOUS | Status: DC | PRN
Start: 2015-03-02 — End: 2015-03-02

## 2015-03-02 MED ORDER — CEFAZOLIN SODIUM-DEXTROSE 2-3 GM-% IV SOLR
INTRAVENOUS | Status: AC
Start: 2015-03-02 — End: 2015-03-02
  Filled 2015-03-02: qty 50

## 2015-03-02 MED ORDER — ACETAMINOPHEN 650 MG RE SUPP: 650.0000 mg | RECTAL | Status: DC | PRN

## 2015-03-02 MED ORDER — FENTANYL CITRATE (PF) 100 MCG/2ML IJ SOLN
INTRAMUSCULAR | Status: DC | PRN
  Administered 2015-03-02: 50 ug via INTRAVENOUS

## 2015-03-02 MED ORDER — BUPIVACAINE-EPINEPHRINE (PF) 0.25% -1:200000 IJ SOLN
INTRAMUSCULAR | Status: AC
  Filled 2015-03-02: qty 30

## 2015-03-02 MED ORDER — SODIUM CHLORIDE 0.9 % IJ SOLN: 3.0000 mL | Freq: Two times a day (BID) | INTRAMUSCULAR | Status: DC

## 2015-03-02 MED ORDER — SODIUM CHLORIDE 0.9 % IJ SOLN: 3.0000 mL | INTRAMUSCULAR | Status: DC | PRN

## 2015-03-02 MED ORDER — PROMETHAZINE HCL 25 MG/ML IJ SOLN: 6.2500 mg | INTRAMUSCULAR | Status: DC | PRN

## 2015-03-02 MED ORDER — PROPOFOL 10 MG/ML IV BOLUS
INTRAVENOUS | Status: DC | PRN
  Administered 2015-03-02: 150 mg via INTRAVENOUS

## 2015-03-02 MED ORDER — FENTANYL CITRATE (PF) 100 MCG/2ML IJ SOLN: 50.0000 ug | INTRAMUSCULAR | Status: DC | PRN

## 2015-03-02 MED ORDER — ONDANSETRON HCL 4 MG/2ML IJ SOLN
INTRAMUSCULAR | Status: DC | PRN
  Administered 2015-03-02: 4 mg via INTRAVENOUS

## 2015-03-02 MED ORDER — MORPHINE SULFATE 2 MG/ML IJ SOLN: 1.0000 mg | INTRAMUSCULAR | Status: DC | PRN

## 2015-03-02 MED ORDER — OXYCODONE HCL 5 MG PO TABS: 5.0000 mg | ORAL_TABLET | ORAL | Status: DC | PRN

## 2015-03-02 MED ORDER — GLYCOPYRROLATE 0.2 MG/ML IJ SOLN: 0.2000 mg | Freq: Once | INTRAMUSCULAR | Status: DC | PRN

## 2015-03-02 MED ORDER — BUPIVACAINE-EPINEPHRINE 0.25% -1:200000 IJ SOLN
INTRAMUSCULAR | Status: DC | PRN
  Administered 2015-03-02: 10 mL

## 2015-03-02 MED ORDER — BUPIVACAINE-EPINEPHRINE (PF) 0.5% -1:200000 IJ SOLN
INTRAMUSCULAR | Status: AC
  Filled 2015-03-02: qty 30

## 2015-03-02 MED ORDER — FENTANYL CITRATE (PF) 100 MCG/2ML IJ SOLN
INTRAMUSCULAR | Status: AC
  Filled 2015-03-02: qty 6

## 2015-03-02 MED ORDER — HYDROMORPHONE HCL 1 MG/ML IJ SOLN: 0.2500 mg | INTRAMUSCULAR | Status: DC | PRN

## 2015-03-02 SURGICAL SUPPLY — 47 items
APPLIER CLIP 9.375 MED OPEN (MISCELLANEOUS) ×3
BLADE HEX COATED 2.75 (ELECTRODE) ×3 IMPLANT
BLADE SURG 15 STRL LF DISP TIS (BLADE) ×1 IMPLANT
BLADE SURG 15 STRL SS (BLADE) ×2
CANISTER SUCT 1200ML W/VALVE (MISCELLANEOUS) ×3 IMPLANT
CHLORAPREP W/TINT 26ML (MISCELLANEOUS) ×3 IMPLANT
CLIP APPLIE 9.375 MED OPEN (MISCELLANEOUS) ×1 IMPLANT
CLIP TI WIDE RED SMALL 6 (CLIP) IMPLANT
CLOSURE WOUND 1/2 X4 (GAUZE/BANDAGES/DRESSINGS)
COVER BACK TABLE 60X90IN (DRAPES) ×3 IMPLANT
COVER MAYO STAND STRL (DRAPES) ×3 IMPLANT
DECANTER SPIKE VIAL GLASS SM (MISCELLANEOUS) IMPLANT
DEVICE DUBIN W/COMP PLATE 8390 (MISCELLANEOUS) IMPLANT
DRAPE LAPAROTOMY 100X72 PEDS (DRAPES) ×3 IMPLANT
DRAPE UTILITY XL STRL (DRAPES) ×3 IMPLANT
DRSG TEGADERM 4X4.75 (GAUZE/BANDAGES/DRESSINGS) IMPLANT
ELECT REM PT RETURN 9FT ADLT (ELECTROSURGICAL) ×3
ELECTRODE REM PT RTRN 9FT ADLT (ELECTROSURGICAL) ×1 IMPLANT
GAUZE SPONGE 4X4 12PLY STRL (GAUZE/BANDAGES/DRESSINGS) IMPLANT
GLOVE BIO SURGEON STRL SZ7 (GLOVE) ×3 IMPLANT
GLOVE BIOGEL PI IND STRL 7.5 (GLOVE) ×1 IMPLANT
GLOVE BIOGEL PI INDICATOR 7.5 (GLOVE) ×2
GLOVE EXAM NITRILE EXT CUFF MD (GLOVE) ×3 IMPLANT
GLOVE SURG SIGNA 7.5 PF LTX (GLOVE) ×3 IMPLANT
GLOVE SURG SS PI 7.5 STRL IVOR (GLOVE) ×3 IMPLANT
GOWN STRL REUS W/ TWL LRG LVL3 (GOWN DISPOSABLE) ×1 IMPLANT
GOWN STRL REUS W/ TWL XL LVL3 (GOWN DISPOSABLE) ×1 IMPLANT
GOWN STRL REUS W/TWL LRG LVL3 (GOWN DISPOSABLE) ×2
GOWN STRL REUS W/TWL XL LVL3 (GOWN DISPOSABLE) ×2
KIT MARKER MARGIN INK (KITS) IMPLANT
LIQUID BAND (GAUZE/BANDAGES/DRESSINGS) ×3 IMPLANT
NEEDLE HYPO 25X1 1.5 SAFETY (NEEDLE) ×3 IMPLANT
NS IRRIG 1000ML POUR BTL (IV SOLUTION) IMPLANT
PACK BASIN DAY SURGERY FS (CUSTOM PROCEDURE TRAY) ×3 IMPLANT
PENCIL BUTTON HOLSTER BLD 10FT (ELECTRODE) ×3 IMPLANT
SLEEVE SCD COMPRESS KNEE MED (MISCELLANEOUS) ×3 IMPLANT
SPONGE LAP 4X18 X RAY DECT (DISPOSABLE) ×3 IMPLANT
STRIP CLOSURE SKIN 1/2X4 (GAUZE/BANDAGES/DRESSINGS) IMPLANT
SUT MNCRL AB 4-0 PS2 18 (SUTURE) ×3 IMPLANT
SUT SILK 2 0 SH (SUTURE) ×3 IMPLANT
SUT VIC AB 3-0 SH 27 (SUTURE) ×2
SUT VIC AB 3-0 SH 27X BRD (SUTURE) ×1 IMPLANT
SYR CONTROL 10ML LL (SYRINGE) ×3 IMPLANT
TOWEL OR 17X24 6PK STRL BLUE (TOWEL DISPOSABLE) ×3 IMPLANT
TUBE CONNECTING 20'X1/4 (TUBING) ×1
TUBE CONNECTING 20X1/4 (TUBING) ×2 IMPLANT
YANKAUER SUCT BULB TIP NO VENT (SUCTIONS) ×3 IMPLANT

## 2015-03-02 NOTE — Op Note (Signed)
RE-EXCISION OF LEFT BREAST CANCER  Procedure Note  Amanda Schneider 03/02/2015   Pre-op Diagnosis: Left Breast Cancer     Post-op Diagnosis: same  Procedure(s): RE-EXCISION OF LEFT BREAST CANCER  Surgeon(s): Coralie Keens, MD  Anesthesia: General  Staff:  Circulator: Ted Mcalpine, RN Scrub Person: Romero Liner, CST  Estimated Blood Loss: Minimal               Specimens: sent to path          [facility] A   Date: 03/02/2015  Time: 10:56 AM

## 2015-03-02 NOTE — Interval H&P Note (Signed)
History and Physical Interval Note:no change in H and P  03/02/2015 9:51 AM  Amanda Schneider  has presented today for surgery, with the diagnosis of Left Breast Cancer  The various methods of treatment have been discussed with the patient and family. After consideration of risks, benefits and other options for treatment, the patient has consented to  Procedure(s): RE-EXCISION OF LEFT BREAST CANCER (Left) as a surgical intervention .  The patient's history has been reviewed, patient examined, no change in status, stable for surgery.  I have reviewed the patient's chart and labs.  Questions were answered to the patient's satisfaction.     [name]

## 2015-03-02 NOTE — Transfer of Care (Signed)
Immediate Anesthesia Transfer of Care Note  Patient: Amanda Schneider  Procedure(s) Performed: Procedure(s): RE-EXCISION OF LEFT BREAST CANCER (Left)  Patient Location: PACU  Anesthesia Type:General  Level of Consciousness: sedated  Airway & Oxygen Therapy: Patient Spontanous Breathing and Patient connected to face mask oxygen  Post-op Assessment: Report given to RN and Post -op Vital signs reviewed and stable  Post vital signs: Reviewed and stable  Last Vitals:  Filed Vitals:   03/02/15 1103  BP:   Pulse: 83  Temp:   Resp: 18    Complications: No apparent anesthesia complications

## 2015-03-02 NOTE — Anesthesia Procedure Notes (Signed)
Procedure Name: LMA Insertion Date/Time: 03/02/2015 10:30 AM Performed by: Melynda Ripple D Pre-anesthesia Checklist: Patient identified, Emergency Drugs available, Suction available and Patient being monitored Patient Re-evaluated:Patient Re-evaluated prior to inductionOxygen Delivery Method: Circle System Utilized Preoxygenation: Pre-oxygenation with 100% oxygen Intubation Type: IV induction Ventilation: Mask ventilation without difficulty LMA: LMA inserted LMA Size: 4.0 Number of attempts: 1 Airway Equipment and Method: Bite block Placement Confirmation: positive ETCO2 Tube secured with: Tape Dental Injury: Teeth and Oropharynx as per pre-operative assessment

## 2015-03-02 NOTE — Anesthesia Preprocedure Evaluation (Signed)
Anesthesia Evaluation  Patient identified by MRN, date of birth, ID band Patient awake    Reviewed: Allergy & Precautions, NPO status , Patient's Chart, lab work & pertinent test results  History of Anesthesia Complications (+) PONV  Airway Mallampati: I  TM Distance: >3 FB Neck ROM: Full    Dental  (+) Partial Upper, Partial Lower, Dental Advisory Given   Pulmonary  breath sounds clear to auscultation        Cardiovascular hypertension, Pt. on medications Rhythm:Regular Rate:Normal     Neuro/Psych    GI/Hepatic GERD-  Medicated and Controlled,  Endo/Other  diabetes, Well Controlled, Type 2, Oral Hypoglycemic Agents  Renal/GU      Musculoskeletal   Abdominal   Peds  Hematology   Anesthesia Other Findings   Reproductive/Obstetrics                             Anesthesia Physical  Anesthesia Plan  ASA: III  Anesthesia Plan: General   Post-op Pain Management:    Induction: Intravenous  Airway Management Planned: LMA  Additional Equipment:   Intra-op Plan:   Post-operative Plan: Extubation in OR  Informed Consent: I have reviewed the patients History and Physical, chart, labs and discussed the procedure including the risks, benefits and alternatives for the proposed anesthesia with the patient or authorized representative who has indicated his/her understanding and acceptance.   Dental advisory given  Plan Discussed with: CRNA, Anesthesiologist and Surgeon  Anesthesia Plan Comments:         Anesthesia Quick Evaluation

## 2015-03-02 NOTE — Anesthesia Postprocedure Evaluation (Signed)
Anesthesia Post Note  Patient: Amanda Schneider  Procedure(s) Performed: Procedure(s) (LRB): RE-EXCISION OF LEFT BREAST CANCER (Left)  Anesthesia type: general  Patient location: PACU  Post pain: Pain level controlled  Post assessment: Patient's Cardiovascular Status Stable  Last Vitals:  Filed Vitals:   03/02/15 1142  BP: 150/76  Pulse: 74  Temp:   Resp: 11    Post vital signs: Reviewed and stable  Level of consciousness: sedated  Complications: No apparent anesthesia complications

## 2015-03-02 NOTE — Discharge Instructions (Signed)
[facility] °Office Phone Number [phone] ° °BREAST BIOPSY/ PARTIAL MASTECTOMY: POST OP INSTRUCTIONS ° °Always review your discharge instruction sheet given to you by the facility where your surgery was performed. ° °IF YOU HAVE DISABILITY OR FAMILY LEAVE FORMS, YOU MUST BRING THEM TO THE OFFICE FOR PROCESSING.  DO NOT GIVE THEM TO YOUR DOCTOR. ° °1. A prescription for pain medication may be given to you upon discharge.  Take your pain medication as prescribed, if needed.  If narcotic pain medicine is not needed, then you may take acetaminophen (Tylenol) or ibuprofen (Advil) as needed. °2. Take your usually prescribed medications unless otherwise directed °3. If you need a refill on your pain medication, please contact your pharmacy.  They will contact our office to request authorization.  Prescriptions will not be filled after 5pm or on week-ends. °4. You should eat very light the first 24 hours after surgery, such as soup, crackers, pudding, etc.  Resume your normal diet the day after surgery. °5. Most patients will experience some swelling and bruising in the breast.  Ice packs and a good support bra will help.  Swelling and bruising can take several days to resolve.  °6. It is common to experience some constipation if taking pain medication after surgery.  Increasing fluid intake and taking a stool softener will usually help or prevent this problem from occurring.  A mild laxative (Milk of Magnesia or Miralax) should be taken according to package directions if there are no bowel movements after 48 hours. °7. Unless discharge instructions indicate otherwise, you may remove your bandages 24-48 hours after surgery, and you may shower at that time.  You may have steri-strips (small skin tapes) in place directly over the incision.  These strips should be left on the skin for 7-10 days.  If your surgeon used skin glue on the incision, you may shower in 24 hours.  The glue will flake off over the  next 2-3 weeks.  Any sutures or staples will be removed at the office during your follow-up visit. °8. ACTIVITIES:  You may resume regular daily activities (gradually increasing) beginning the next day.  Wearing a good support bra or sports bra minimizes pain and swelling.  You may have sexual intercourse when it is comfortable. °a. You may drive when you no longer are taking prescription pain medication, you can comfortably wear a seatbelt, and you can safely maneuver your car and apply brakes. °b. RETURN TO WORK:  ______________________________________________________________________________________ °9. You should see your doctor in the office for a follow-up appointment approximately two weeks after your surgery.  Your doctor’s nurse will typically make your follow-up appointment when she calls you with your pathology report.  Expect your pathology report 2-3 business days after your surgery.  You may call to check if you do not hear from us after three days. °10. OTHER INSTRUCTIONS: _______________________________________________________________________________________________ _____________________________________________________________________________________________________________________________________ °_____________________________________________________________________________________________________________________________________ °_____________________________________________________________________________________________________________________________________ ° °WHEN TO CALL YOUR DOCTOR: °1. Fever over 101.0 °2. Nausea and/or vomiting. °3. Extreme swelling or bruising. °4. Continued bleeding from incision. °5. Increased pain, redness, or drainage from the incision. ° °The clinic staff is available to answer your questions during regular business hours.  Please don’t hesitate to call and ask to speak to one of the nurses for clinical concerns.  If you have a medical emergency, go to the nearest  emergency room or call 911.  A surgeon from [facility] is always on call at the hospital. ° °For further questions, please visit [email or web address]  ° ° ° °  Post Anesthesia Home Care Instructions ° °Activity: °Get plenty of rest for the remainder of the day. A responsible adult should stay with you for 24 hours following the procedure.  °For the next 24 hours, DO NOT: °-Drive a car °-Operate machinery °-Drink alcoholic beverages °-Take any medication unless instructed by your physician °-Make any legal decisions or sign important papers. ° °Meals: °Start with liquid foods such as gelatin or soup. Progress to regular foods as tolerated. Avoid greasy, spicy, heavy foods. If nausea and/or vomiting occur, drink only clear liquids until the nausea and/or vomiting subsides. Call your physician if vomiting continues. ° °Special Instructions/Symptoms: °Your throat may feel dry or sore from the anesthesia or the breathing tube placed in your throat during surgery. If this causes discomfort, gargle with warm salt water. The discomfort should disappear within 24 hours. ° °If you had a scopolamine patch placed behind your ear for the management of post- operative nausea and/or vomiting: ° °1. The medication in the patch is effective for 72 hours, after which it should be removed.  Wrap patch in a tissue and discard in the trash. Wash hands thoroughly with soap and water. °2. You may remove the patch earlier than 72 hours if you experience unpleasant side effects which may include dry mouth, dizziness or visual disturbances. °3. Avoid touching the patch. Wash your hands with soap and water after contact with the patch. °  ° °

## 2015-03-03 ENCOUNTER — Encounter (HOSPITAL_BASED_OUTPATIENT_CLINIC_OR_DEPARTMENT_OTHER): Payer: Self-pay | Admitting: Surgery

## 2015-03-03 NOTE — Op Note (Signed)
NAMEMEITAL, RIEHL               ACCOUNT NO.:  [id number]  MEDICAL RECORD NO.:  [id number]  LOCATION:                                 FACILITY:  PHYSICIAN:  Coralie Keens, M.D. DATE OF BIRTH:  05-10-1935  DATE OF PROCEDURE:  03/02/2015 DATE OF DISCHARGE:  03/02/2015                              OPERATIVE REPORT   PREOPERATIVE DIAGNOSIS:  Left breast cancer.  POSTOPERATIVE DIAGNOSIS:  Left breast cancer.  PROCEDURE:  Re-excision of left breast cancer for positive margins.  SURGEON:  Coralie Keens, MD  ANESTHESIA:  General and 0.25% Marcaine.  ESTIMATED BLOOD LOSS:  Minimal.  INDICATIONS:  This is a 80 year old female who has undergone a recent radioactive seed localized left breast lumpectomy for invasive breast cancer as well as sentinel lymph node biopsy.  The anterior moderate tumor was focally positive.  Decision was made to proceed with re- excision of the anterior margin.  PROCEDURE IN DETAIL:  The patient was brought to the operating room, identified as Amanda Schneider.  She was placed supine on the operating room table and general anesthesia was induced.  Her left breast was prepped and draped in usual sterile fashion.  Her previous scar was along the edge of the areola.  I completely excised the scar with a scalpel and took this down to the breast tissue with electrocautery.  I then completely excised the anterior margin of the previous lumpectomy cavity.  This was sent to Pathology for evaluation.  I then placed surgical clips around the margins again for radiograph purposes.  I injected the wound with further Marcaine.  I then closed the subcutaneous tissue with interrupted 3-0 Vicryl sutures and closed the skin with a running 4-0 Monocryl.  Skin glue was then applied.  The patient tolerated the procedure well.  All the counts were correct at the end of procedure.  The patient was then taken in a stable condition from the operating room to the recovery  room.     Coralie Keens, M.D.     DB/MEDQ  D:  03/02/2015  T:  03/03/2015  Job:  [id number]

## 2015-03-04 NOTE — Addendum Note (Signed)
Addendum  created 03/04/15 2909 by Tawni Millers, CRNA   Modules edited: Charges VN

## 2015-03-17 ENCOUNTER — Ambulatory Visit: Payer: Medicare Other | Admitting: Hematology and Oncology

## 2015-03-18 ENCOUNTER — Ambulatory Visit
Admission: RE | Admit: 2015-03-18 | Discharge: 2015-03-18 | Disposition: A | Discharge status: Home / Self Care | Payer: Medicare Other | Source: Ambulatory Visit | Attending: Radiation Oncology | Admitting: Radiation Oncology

## 2015-03-18 VITALS — BP 150/89 | HR 96 | Temp 98.0°F | Wt 147.6 lb

## 2015-03-18 DIAGNOSIS — C50912 Malignant neoplasm of unspecified site of left female breast: Secondary | ICD-10-CM

## 2015-03-18 NOTE — Progress Notes (Signed)
   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  Interval History: Amanda Schneider presents today for routine followup.  She had her lumpectomy on 4/19.  This showed a 2.2 cm tumor with associated DCIS which was close to the anterior and posterior margins. 2 sentinel lymph nodes were negative. She had re-excision on 5/17 which showed no malignancy. Her oncotype was 0 so she will not be receiving chemotherapy. She was ER+PR+. She has some soreness especially in her axilla. She is accompanied by an interpreter and her daughter.   Physical Exam:  Filed Vitals:   03/18/15 0837  BP: 150/89  Pulse: 96  Temp: 98 F (36.7 C)  Weight: 147 lb 9.6 oz (66.951 kg)   Pleasant female. Well healing lumpectomy and SLN incision. Alert and oriented  IMPRESSION: Amanda Schneider is a 80 y.o. female s/p lumpectomy   PLAN: I spoke to the patient today regarding her diagnosis and options for treatment. We discussed the equivalence in terms of survival and local failure between mastectomy and breast conservation. We discussed the role of radiation in decreasing local failures in patients who undergo lumpectomy. We discussed the process of simulation and the placement tattoos. We discussed 4-6 weeks of treatment as an outpatient. We discussed the possibility of asymptomatic lung damage. We discussed the low likelihood of secondary malignancies. We discussed the possible side effects including but not limited to skin redness, fatigue, permanent skin darkening, and breast swelling.   She is scheduled for simulation next Tuesday.   Thea Silversmith, MD  This document serves as a record of services personally performed by Thea Silversmith, MD. It was created on her behalf by Derek Mound, a trained medical scribe. The  creation of this record is based on the scribe's personal observations and the provider's statements to them. This document has been checked and approved by the attending provider.

## 2015-03-18 NOTE — Addendum Note (Signed)
Encounter addended by: Norm Salt, RN on: 03/18/2015 10:59 AM<BR>     Documentation filed: Charges VN

## 2015-03-18 NOTE — Progress Notes (Signed)
Patient back as follow up new consult post surgery.  02/03/15: BREAST LUMPECTOMY WITH SENTINEL LYMPH NODE BX Diagnosis 1. Breast, lumpectomy, Left - INVASIVE GRADE II DUCTAL CARCINOMA, SPANNING 2.2 CM IN GREATEST DIMENSION. - ASSOCIATED INTERMEDIATE GRADE DUCTAL CARCINOMA IN SITU. - SEPARATE FOCI OF INTERMEDIATE GRADE DUCTAL CARCINOMA IN SITU (THREE ADDITIONAL FOCI MEASURING 1.3, 1.0, AND 0.9 CM IN GREATEST DIMENSION). - ANTERIOR MARGIN IS POSITIVE FOR INVASIVE DUCTAL CARCINOMA. - DUCTAL CARCINOMA IN SITU IS EXTREMELY CLOSE (LESS THAN 0.1 CM) TO ANTERIOR MARGIN. - DUCTAL CARCINOMA IN SITU IS CLOSE (0.19 CM) TO POSTERIOR MARGIN. - OTHER MARGINS ARE NEGATIVE. - SEE ONCOLOGY TEMPLATE. 2. Lymph node, sentinel, biopsy, Left 1 of 4 FINAL for [name], [name] ([id number]) Diagnosis(continued) - ONE BENIGN FATTY REPLACED LYMPH NODE WITH NO TUMOR SEEN (0/1). 3. Lymph node, sentinel, biopsy, Left #2 - ONE BENIGN FATTY REPLACED LYMPH NODE WITH NO TUMOR SEEN (0/1).  03/02/15:RE-EXCISION OF BREAST LUMPECTOMY by Dr.Blackmon  Oncotype Score is 0

## 2015-03-18 NOTE — Progress Notes (Signed)
Please see the Nurse Progress Note in the MD Initial Consult Encounter for this patient. 

## 2015-03-24 ENCOUNTER — Ambulatory Visit
Admission: RE | Admit: 2015-03-24 | Discharge: 2015-03-24 | Disposition: A | Discharge status: Home / Self Care | Payer: Medicare Other | Source: Ambulatory Visit | Attending: Radiation Oncology | Admitting: Radiation Oncology

## 2015-03-24 DIAGNOSIS — C50912 Malignant neoplasm of unspecified site of left female breast: Secondary | ICD-10-CM

## 2015-03-24 NOTE — Progress Notes (Signed)
Radiation Oncology         [phone]) [phone] ________________________________  Name: Amanda Schneider      MRN: [id number]          Date: 03/24/2015              DOB: 09/07/1935  Optical Surface Tracking Plan:  Since intensity modulated radiotherapy (IMRT) and 3D conformal radiation treatment methods are predicated on accurate and precise positioning for treatment, intrafraction motion monitoring is medically necessary to ensure accurate and safe treatment delivery.  The ability to quantify intrafraction motion without excessive ionizing radiation dose can only be performed with optical surface tracking. Accordingly, surface imaging offers the opportunity to obtain 3D measurements of patient position throughout IMRT and 3D treatments without excessive radiation exposure.  I am ordering optical surface tracking for this patient's upcoming course of radiotherapy. ________________________________ Signature   Reference:   Ursula Alert, J, et al. Surface imaging-based analysis of intrafraction motion for breast radiotherapy patients.Journal of [place], n. 6, nov. 2014. ISSN [id number].   Available at: <http://www.jacmp.org/index.php/jacmp/article/view/4957>.

## 2015-03-24 NOTE — Progress Notes (Signed)
Name: Jurnee Nakayama   MRN: [id number]  Date:  03/24/2015  DOB: 08-Oct-1935  Status:outpatient    DIAGNOSIS: Breast cancer.  CONSENT VERIFIED: yes   SET UP: Patient is setup supine   IMMOBILIZATION:  The following immobilization was used:Custom Moldable Pillow, breast board.   NARRATIVE: Ms. Ryser was brought to the [facility].  Identity was confirmed.  All relevant records and images related to the planned course of therapy were reviewed.  Then, the patient was positioned in a stable reproducible clinical set-up for radiation therapy.  Wires were placed to delineate the clinical extent of breast tissue. A wire was placed on the scar as well.  CT images were obtained.  An isocenter was placed. Skin markings were placed.  The CT images were loaded into the planning software where the target and avoidance structures were contoured.  The radiation prescription was entered and confirmed. The patient was discharged in stable condition and tolerated simulation well.    TREATMENT PLANNING NOTE:  Treatment planning then occurred. I have requested : MLC's, isodose plan, basic dose calculation  I personally designed and supervised the construction of 3 medically necessary complex treatment devices for the protection of critical normal structures including the lungs and contralateral breast as well as the immobilization device which is necessary for set up certainty.   3D simulation occurred. I requested and analyzed a dose volume histogram of the heart, lungs and lumpectomy cavity.

## 2015-03-27 DIAGNOSIS — C50912 Malignant neoplasm of unspecified site of left female breast: Secondary | ICD-10-CM | POA: Diagnosis not present

## 2015-03-31 ENCOUNTER — Ambulatory Visit
Admission: RE | Admit: 2015-03-31 | Discharge: 2015-03-31 | Disposition: A | Discharge status: Home / Self Care | Payer: Medicare Other | Source: Ambulatory Visit | Attending: Radiation Oncology | Admitting: Radiation Oncology

## 2015-03-31 DIAGNOSIS — C50912 Malignant neoplasm of unspecified site of left female breast: Secondary | ICD-10-CM | POA: Diagnosis not present

## 2015-04-01 ENCOUNTER — Ambulatory Visit
Admission: RE | Admit: 2015-04-01 | Discharge: 2015-04-01 | Disposition: A | Discharge status: Home / Self Care | Payer: Medicare Other | Source: Ambulatory Visit | Attending: Radiation Oncology | Admitting: Radiation Oncology

## 2015-04-01 DIAGNOSIS — C50912 Malignant neoplasm of unspecified site of left female breast: Secondary | ICD-10-CM | POA: Diagnosis not present

## 2015-04-02 ENCOUNTER — Ambulatory Visit
Admission: RE | Admit: 2015-04-02 | Discharge: 2015-04-02 | Disposition: A | Discharge status: Home / Self Care | Payer: Medicare Other | Source: Ambulatory Visit | Attending: Radiation Oncology | Admitting: Radiation Oncology

## 2015-04-02 DIAGNOSIS — C50912 Malignant neoplasm of unspecified site of left female breast: Secondary | ICD-10-CM | POA: Diagnosis not present

## 2015-04-03 ENCOUNTER — Ambulatory Visit
Admission: RE | Admit: 2015-04-03 | Discharge: 2015-04-03 | Disposition: A | Discharge status: Home / Self Care | Payer: Medicare Other | Source: Ambulatory Visit | Attending: Radiation Oncology | Admitting: Radiation Oncology

## 2015-04-03 DIAGNOSIS — C50912 Malignant neoplasm of unspecified site of left female breast: Secondary | ICD-10-CM | POA: Diagnosis not present

## 2015-04-03 MED ORDER — RADIAPLEXRX EX GEL
Freq: Once | CUTANEOUS | Status: AC
  Administered 2015-04-03: 16:00:00 via TOPICAL

## 2015-04-03 MED ORDER — ALRA NON-METALLIC DEODORANT (RAD-ONC)
1.0000 | Freq: Once | TOPICAL | Status: AC
Start: 2015-04-03 — End: 2015-04-03
  Administered 2015-04-03: 1 via TOPICAL

## 2015-04-06 ENCOUNTER — Ambulatory Visit
Admission: RE | Admit: 2015-04-06 | Discharge: 2015-04-06 | Disposition: A | Discharge status: Home / Self Care | Payer: Medicare Other | Source: Ambulatory Visit | Attending: Radiation Oncology | Admitting: Radiation Oncology

## 2015-04-06 DIAGNOSIS — C50912 Malignant neoplasm of unspecified site of left female breast: Secondary | ICD-10-CM | POA: Diagnosis not present

## 2015-04-06 MED ORDER — RADIAPLEXRX EX GEL
Freq: Once | CUTANEOUS | Status: AC
  Administered 2015-04-06: 14:00:00 via TOPICAL

## 2015-04-06 MED ORDER — ALRA NON-METALLIC DEODORANT (RAD-ONC)
1.0000 | Freq: Once | TOPICAL | Status: AC
Start: 2015-04-06 — End: 2015-04-06
  Administered 2015-04-06: 1 via TOPICAL

## 2015-04-06 NOTE — Progress Notes (Signed)
Pt education done with patient and daughter Beverlee Nims, gave Radiation therapy and you book,  alra, radiaplex gel,[employer] card, discussed ways to manage side effects/symptoms yo daughter Beverlee Nims, fatigue, skin irritation,pain, use radiaplex gel after rad txs and bedtime daily and on weekends, alra after rad txs daily and prn, patient non english speaking, increase protein in diet, drink plenty fluids, radiaplex  to sooth skin once it becomes irritated, will review again tomorrow after MD visit, sees MD weekly and prn Beverlee Nims gave verbal undertanding, and spoke spanish to patient who nodded yes 2:26 PM

## 2015-04-07 ENCOUNTER — Ambulatory Visit
Admission: RE | Admit: 2015-04-07 | Discharge: 2015-04-07 | Disposition: A | Discharge status: Home / Self Care | Payer: Medicare Other | Source: Ambulatory Visit | Attending: Radiation Oncology | Admitting: Radiation Oncology

## 2015-04-07 VITALS — BP 144/74 | HR 77 | Temp 97.8°F | Wt 147.1 lb

## 2015-04-07 DIAGNOSIS — C50912 Malignant neoplasm of unspecified site of left female breast: Secondary | ICD-10-CM | POA: Diagnosis not present

## 2015-04-07 NOTE — Progress Notes (Signed)
Weekly assessment of radiation to left [employer] 5 of 16 treatments.No skin changes.Informed not to wear underwire bra and reinforced where to apply radiaplex.

## 2015-04-07 NOTE — Progress Notes (Signed)
Weekly Management Note Current Dose: 13.35  Gy  Projected Dose: 42.72 Gy   Narrative:  The patient presents for routine under treatment assessment.  CBCT/MVCT images/Port film x-rays were reviewed.  The chart was checked. Doing well. No complaints.   Physical Findings: Weight: 147 lb 1.6 oz (66.724 kg). Unchanged  Impression:  The patient is tolerating radiation.  Plan:  Continue treatment as planned. Continue radaiplex.

## 2015-04-08 ENCOUNTER — Ambulatory Visit
Admission: RE | Admit: 2015-04-08 | Discharge: 2015-04-08 | Disposition: A | Discharge status: Home / Self Care | Payer: Medicare Other | Source: Ambulatory Visit | Attending: Radiation Oncology | Admitting: Radiation Oncology

## 2015-04-08 DIAGNOSIS — C50912 Malignant neoplasm of unspecified site of left female breast: Secondary | ICD-10-CM | POA: Diagnosis not present

## 2015-04-09 ENCOUNTER — Ambulatory Visit
Admission: RE | Admit: 2015-04-09 | Discharge: 2015-04-09 | Disposition: A | Discharge status: Home / Self Care | Payer: Medicare Other | Source: Ambulatory Visit | Attending: Radiation Oncology | Admitting: Radiation Oncology

## 2015-04-09 DIAGNOSIS — C50912 Malignant neoplasm of unspecified site of left female breast: Secondary | ICD-10-CM | POA: Diagnosis not present

## 2015-04-10 ENCOUNTER — Ambulatory Visit
Admission: RE | Admit: 2015-04-10 | Discharge: 2015-04-10 | Disposition: A | Discharge status: Home / Self Care | Payer: Medicare Other | Source: Ambulatory Visit | Attending: Radiation Oncology | Admitting: Radiation Oncology

## 2015-04-10 DIAGNOSIS — C50912 Malignant neoplasm of unspecified site of left female breast: Secondary | ICD-10-CM | POA: Diagnosis not present

## 2015-04-13 ENCOUNTER — Ambulatory Visit
Admission: RE | Admit: 2015-04-13 | Discharge: 2015-04-13 | Disposition: A | Discharge status: Home / Self Care | Payer: Medicare Other | Source: Ambulatory Visit | Attending: Radiation Oncology | Admitting: Radiation Oncology

## 2015-04-13 DIAGNOSIS — C50912 Malignant neoplasm of unspecified site of left female breast: Secondary | ICD-10-CM | POA: Diagnosis not present

## 2015-04-14 ENCOUNTER — Encounter: Payer: Self-pay | Admitting: Radiation Oncology

## 2015-04-14 ENCOUNTER — Ambulatory Visit
Admission: RE | Admit: 2015-04-14 | Discharge: 2015-04-14 | Disposition: A | Discharge status: Home / Self Care | Payer: Medicare Other | Source: Ambulatory Visit | Attending: Radiation Oncology | Admitting: Radiation Oncology

## 2015-04-14 VITALS — BP 162/80 | HR 79 | Temp 97.6°F | Resp 20 | Wt 147.7 lb

## 2015-04-14 DIAGNOSIS — C50912 Malignant neoplasm of unspecified site of left female breast: Secondary | ICD-10-CM

## 2015-04-14 NOTE — Progress Notes (Signed)
Weekly rad txs left breast, 10/16 completed, slight tanning, inverted nipple, using radiaplex bid, interpreter Deborra Medina  With patient nad daughter, appetite good, no fatigue 10:36 AM BP 162/80 mmHg  Pulse 79  Temp(Src) 97.6 F (36.4 C) (Oral)  Resp 20  Wt 147 lb 11.2 oz (66.996 kg)  Wt Readings from Last 3 Encounters:  04/14/15 147 lb 11.2 oz (66.996 kg)  04/07/15 147 lb 1.6 oz (66.724 kg)  03/18/15 147 lb 9.6 oz (66.951 kg)

## 2015-04-14 NOTE — Progress Notes (Signed)
  Radiation Oncology         [phone]   Name: Amanda Schneider MRN: [id number]   Date: 04/14/2015  DOB: 07/25/1935   Weekly Radiation Therapy Management    ICD-9-CM ICD-10-CM   1. Breast cancer, left breast 174.9 C50.912     Current Dose: 26.7 Gy  Planned Dose:  42.72 Gy  Narrative The patient presents for routine under treatment assessment. Slight tanning, inverted nipple, using radiaplex bid, interpreter Deborra Medina With patient and daughter, appetite good, no fatigue. She was curious about sun exposure. The patient is without complaint. Set-up films were reviewed. The chart was checked.  Physical Findings  weight is 147 lb 11.2 oz (66.996 kg). Her oral temperature is 97.6 F (36.4 C). Her blood pressure is 162/80 and her pulse is 79. Her respiration is 20. . Weight essentially stable.  No significant changes.  Impression The patient is tolerating radiation.  Plan Continue treatment as planned.     This document serves as a record of services personally performed by Tyler Pita, MD. It was created on his behalf by Arlyce Harman, a trained medical scribe. The creation of this record is based on the scribe's personal observations and the provider's statements to them. This document has been checked and approved by the attending provider.       Sheral Apley Tammi Klippel, M.D.

## 2015-04-15 ENCOUNTER — Ambulatory Visit
Admission: RE | Admit: 2015-04-15 | Discharge: 2015-04-15 | Disposition: A | Discharge status: Home / Self Care | Payer: Medicare Other | Source: Ambulatory Visit | Attending: Radiation Oncology | Admitting: Radiation Oncology

## 2015-04-15 DIAGNOSIS — C50912 Malignant neoplasm of unspecified site of left female breast: Secondary | ICD-10-CM | POA: Diagnosis not present

## 2015-04-16 ENCOUNTER — Ambulatory Visit
Admission: RE | Admit: 2015-04-16 | Discharge: 2015-04-16 | Disposition: A | Discharge status: Home / Self Care | Payer: Medicare Other | Source: Ambulatory Visit | Attending: Radiation Oncology | Admitting: Radiation Oncology

## 2015-04-16 DIAGNOSIS — C50912 Malignant neoplasm of unspecified site of left female breast: Secondary | ICD-10-CM | POA: Diagnosis not present

## 2015-04-17 ENCOUNTER — Ambulatory Visit
Admission: RE | Admit: 2015-04-17 | Discharge: 2015-04-17 | Disposition: A | Discharge status: Home / Self Care | Payer: Medicare Other | Source: Ambulatory Visit | Attending: Radiation Oncology | Admitting: Radiation Oncology

## 2015-04-17 DIAGNOSIS — C50912 Malignant neoplasm of unspecified site of left female breast: Secondary | ICD-10-CM | POA: Diagnosis not present

## 2015-04-21 ENCOUNTER — Ambulatory Visit
Admission: RE | Admit: 2015-04-21 | Discharge: 2015-04-21 | Disposition: A | Discharge status: Home / Self Care | Payer: Medicare Other | Source: Ambulatory Visit | Attending: Radiation Oncology | Admitting: Radiation Oncology

## 2015-04-21 VITALS — BP 157/77 | HR 76 | Temp 97.7°F | Wt 146.3 lb

## 2015-04-21 DIAGNOSIS — Z170 Estrogen receptor positive status [ER+]: Secondary | ICD-10-CM | POA: Insufficient documentation

## 2015-04-21 DIAGNOSIS — Z510 Encounter for antineoplastic radiation therapy: Secondary | ICD-10-CM | POA: Insufficient documentation

## 2015-04-21 DIAGNOSIS — C50912 Malignant neoplasm of unspecified site of left female breast: Secondary | ICD-10-CM | POA: Insufficient documentation

## 2015-04-21 MED ORDER — RADIAPLEXRX EX GEL
Freq: Once | CUTANEOUS | Status: AC
  Administered 2015-04-21: 12:00:00 via TOPICAL

## 2015-04-21 NOTE — Progress Notes (Signed)
Weekly Management Note Current Dose: 37.38  Gy  Projected Dose: 52.72 Gy   Narrative:  The patient presents for routine under treatment assessment.  CBCT/MVCT images/Port film x-rays were reviewed.  The chart was checked. Doing well. No complaints.   Physical Findings: Weight: 146 lb 4.8 oz (66.361 kg). Some skin darkening over left breast.   Impression:  The patient is tolerating radiation.  Plan:  Continue treatment as planned. Continue radiaplex

## 2015-04-22 ENCOUNTER — Ambulatory Visit
Admission: RE | Admit: 2015-04-22 | Discharge: 2015-04-22 | Disposition: A | Discharge status: Home / Self Care | Payer: Medicare Other | Source: Ambulatory Visit | Attending: Radiation Oncology | Admitting: Radiation Oncology

## 2015-04-22 ENCOUNTER — Encounter: Payer: Self-pay | Admitting: Hematology and Oncology

## 2015-04-22 ENCOUNTER — Ambulatory Visit (HOSPITAL_BASED_OUTPATIENT_CLINIC_OR_DEPARTMENT_OTHER): Payer: Medicare Other | Admitting: Hematology and Oncology

## 2015-04-22 VITALS — BP 144/74 | HR 76 | Temp 97.6°F | Resp 18 | Ht 62.0 in | Wt 150.3 lb

## 2015-04-22 DIAGNOSIS — Z170 Estrogen receptor positive status [ER+]: Secondary | ICD-10-CM | POA: Diagnosis not present

## 2015-04-22 DIAGNOSIS — C50912 Malignant neoplasm of unspecified site of left female breast: Secondary | ICD-10-CM

## 2015-04-22 DIAGNOSIS — M810 Age-related osteoporosis without current pathological fracture: Secondary | ICD-10-CM | POA: Diagnosis not present

## 2015-04-22 DIAGNOSIS — C50812 Malignant neoplasm of overlapping sites of left female breast: Secondary | ICD-10-CM | POA: Diagnosis present

## 2015-04-22 DIAGNOSIS — Z510 Encounter for antineoplastic radiation therapy: Secondary | ICD-10-CM | POA: Diagnosis not present

## 2015-04-22 MED ORDER — ALENDRONATE SODIUM 70 MG PO TABS: 70.0000 mg | ORAL_TABLET | ORAL | Status: DC

## 2015-04-22 NOTE — Assessment & Plan Note (Signed)
Left lumpectomy 02/03/2015: Grade 2 IDC, 2.2 cm with intermediate grade DCIS, 3 additional foci 1.3, 1, 0.9 cm DCIS close margins, 0/2 lymph nodes negative, T2 N0 stage II a, Re-excision margins clear. Oncotype DX score 0, 3% risk of recurrence; currently on adjuvant radiation  Recommendation: 1. Rolling radiation therapy to receive adjuvant antiestrogen therapy with anastrozole 1 mg daily 5 years Anastrozole counseling:We discussed the risks and benefits of anti-estrogen therapy with aromatase inhibitors. These include but not limited to insomnia, hot flashes, mood changes, vaginal dryness, bone density loss, and weight gain. Although rare, serious side effects including endometrial cancer, risk of blood clots were also discussed. We strongly believe that the benefits far outweigh the risks. Patient understands these risks and consented to starting treatment. Planned treatment duration is 5 years.  Return to clinic 1 month after starting antiestrogen therapy.

## 2015-04-22 NOTE — Progress Notes (Signed)
Patient Care Team: Pcp Not In System as PCP - General  DIAGNOSIS: No matching staging information was found for the patient.  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 She is here today to discuss the antiestrogen therapy. She is doing very well from radiation standpoint with very minimal discomfort.  REVIEW OF SYSTEMS:   Constitutional: Denies fevers, chills or abnormal weight loss Eyes: Denies blurriness of vision Ears, nose, mouth, throat, and face: Denies mucositis or sore throat Respiratory: Denies cough, dyspnea or wheezes Cardiovascular: Denies palpitation, chest discomfort or lower extremity swelling Gastrointestinal:  Denies nausea, heartburn or change in bowel habits Skin: Denies abnormal skin rashes Lymphatics: Denies new lymphadenopathy or easy bruising Neurological:Denies numbness, tingling or new weaknesses Behavioral/Psych: Mood is stable, no new changes  Breast:  denies any pain or lumps or nodules in either breasts All other systems were reviewed with the patient and are negative.  I have reviewed the past medical history, past surgical history, social history and family history with the patient and they are unchanged from previous note.  ALLERGIES:  has No Known Allergies.  MEDICATIONS:  Current Outpatient Prescriptions  Medication Sig Dispense Refill  . alendronate (FOSAMAX) 70  MG tablet Take 70 mg by mouth once a week. Take with a full glass of water on an empty stomach.    . calcium-vitamin D (OSCAL WITH D) 250-125 MG-UNIT per tablet Take 1 tablet by mouth daily.    . cyclobenzaprine (FLEXERIL) 10 MG tablet Take 10 mg by mouth daily.    Marland Kitchen HYDROcodone-acetaminophen (NORCO) 5-325 MG per tablet Take 1-2 tablets by mouth every 4 (four) hours as needed. 30 tablet 0  . linagliptin (TRADJENTA) 5 MG TABS tablet Take 5 mg by mouth daily.    Marland Kitchen losartan (COZAAR) 50 MG tablet Take 1 tablet (50 mg total) by mouth daily. 30 tablet 0  . meloxicam (MOBIC) 15 MG tablet Take 15 mg by mouth daily.    . non-metallic deodorant Jethro Poling) MISC Apply 1 application topically daily.    Marland Kitchen omeprazole (PRILOSEC) 40 MG capsule Take 40 mg by mouth daily.    . Wound Dressings (RADIAGEL) GEL Apply 170 g topically 2 (two) times daily.     No current facility-administered medications for this visit.    PHYSICAL EXAMINATION: ECOG PERFORMANCE STATUS: 1 - Symptomatic but completely ambulatory  Filed Vitals:   04/22/15 1511  BP: 144/74  Pulse: 76  Temp: 97.6 F (36.4 C)  Resp: 18   Filed Weights   04/22/15 1511  Weight: 150 lb 4.8 oz (68.176 kg)    GENERAL:alert, no distress and comfortable SKIN: skin color, texture, turgor are normal, no rashes or significant lesions EYES: normal, Conjunctiva are pink and non-injected, sclera clear OROPHARYNX:no exudate, no erythema and lips, buccal mucosa, and tongue normal  NECK: supple, thyroid normal size, non-tender, without nodularity LYMPH:  no palpable lymphadenopathy in the cervical, axillary or inguinal LUNGS: clear  to auscultation and percussion with normal breathing effort HEART: regular rate & rhythm and no murmurs and no lower extremity edema ABDOMEN:abdomen soft, non-tender and normal bowel sounds Musculoskeletal:no cyanosis of digits and no clubbing  NEURO: alert & oriented x 3 with fluent speech, no focal motor/sensory  deficits   LABORATORY DATA:  I have reviewed the data as listed   Chemistry      Component Value Date/Time   NA 136 02/02/2015 0940   K 5.0 02/02/2015 0940   CL 102 02/02/2015 0940   CO2 24 02/02/2015 0940   BUN 11 02/02/2015 0940   CREATININE 0.78 02/02/2015 0940      Component Value Date/Time   CALCIUM 9.8 02/02/2015 0940       Lab Results  Component Value Date   WBC 7.0 12/16/2014   HGB 13.0 03/02/2015   HCT 34.8* 12/16/2014   MCV 85.1 12/16/2014   PLT 286 12/16/2014   NEUTROABS 7.9* 12/15/2014   ASSESSMENT & PLAN:  Breast cancer, left breast Left lumpectomy 02/03/2015: Grade 2 IDC, 2.2 cm with intermediate grade DCIS, 3 additional foci 1.3, 1, 0.9 cm DCIS close margins, 0/2 lymph nodes negative, T2 N0 stage II a, Re-excision margins clear. Oncotype DX score 0, 3% risk of recurrence; currently on adjuvant radiation  Recommendation: 1. Following radiation therapy (which completes tomorrow ) to receive adjuvant antiestrogen therapy  I discussed different options especially anastrozole versus tamoxifen. The reason to consider tamoxifen is because she has history of osteoporosis and takes Fosamax with calcium and vitamin D. I recommended to recheck her bone density. If the bone density shows osteoporosis and I will consider using tamoxifen. The bone density shows osteopenia then we can go with anastrozole.  Anastrozole counseling:We discussed the risks and benefits of anti-estrogen therapy with aromatase inhibitors. These include but not limited to insomnia, hot flashes, mood changes, vaginal dryness, bone density loss, and weight gain. Although rare, serious side effects including endometrial cancer, risk of blood clots were also discussed. We strongly believe that the benefits far outweigh the risks. Patient understands these risks and consented to starting treatment. Planned treatment duration is 5 years.  Return to clinic 3 months.  No orders of the defined types were  placed in this encounter.   The patient has a good understanding of the overall plan. she agrees with it. she will call with any problems that may develop before the next visit here.   Rulon Eisenmenger, MD

## 2015-04-23 ENCOUNTER — Encounter: Payer: Self-pay | Admitting: Radiation Oncology

## 2015-04-23 ENCOUNTER — Ambulatory Visit
Admission: RE | Admit: 2015-04-23 | Discharge: 2015-04-23 | Disposition: A | Discharge status: Home / Self Care | Payer: Medicare Other | Source: Ambulatory Visit | Attending: Radiation Oncology | Admitting: Radiation Oncology

## 2015-04-23 DIAGNOSIS — Z510 Encounter for antineoplastic radiation therapy: Secondary | ICD-10-CM | POA: Diagnosis not present

## 2015-04-30 NOTE — Progress Notes (Signed)
  Radiation Oncology         [phone]) [phone] ________________________________  Name: Amanda Schneider MRN: [id number]  Date: 04/23/2015  DOB: December 25, 1934  End of Treatment Note  Diagnosis:   No matching staging information was found for the patient.   Indication for treatment:  Curative     Radiation treatment dates:   04/01/2015-04/23/2015  Site/dose:   Left breast/ 42.72 Gy at 2.67 Gy per fraction x 21 fractions.   Beams/energy:  Opposed tangents with reduced fields / 6 MV photons  Narrative: The patient tolerated radiation treatment relatively well.   She had minimal hyperpigmentation and no fatiuge.   Plan: The patient has completed radiation treatment. The patient will return to radiation oncology clinic for routine followup in one month. I advised them to call or return sooner if they have any questions or concerns related to their recovery or treatment.  ------------------------------------------------  Thea Silversmith, MD

## 2015-05-01 ENCOUNTER — Telehealth: Payer: Self-pay

## 2015-05-01 NOTE — Telephone Encounter (Signed)
Bone density order faxed to [facility].  Sent to scan 

## 2015-05-13 ENCOUNTER — Telehealth: Payer: Self-pay

## 2015-05-13 DIAGNOSIS — C50912 Malignant neoplasm of unspecified site of left female breast: Secondary | ICD-10-CM

## 2015-05-13 MED ORDER — ANASTROZOLE 1 MG PO TABS: 1.0000 mg | ORAL_TABLET | Freq: Every day | ORAL | Status: DC

## 2015-05-13 NOTE — Telephone Encounter (Signed)
Soonest available appt for bone density test is Aug 5th. Pt is leaving country 7/29 for about 1 1/2 months. Does Dr Lindi Adie want to prescribe anastrazole before she goes out of country and before she gets her bone density test?

## 2015-05-13 NOTE — Addendum Note (Signed)
Addended by: Prentiss Bells on: 05/13/2015 04:36 PM   Modules accepted: Orders

## 2015-05-13 NOTE — Telephone Encounter (Signed)
Let Dallana know that prescription for anastrazole has been went to [facility] on [address] and pt can go ahead and start it.  Pt to have bone density when she returns from intl trip.  Dallana voiced understanding.

## 2015-05-28 ENCOUNTER — Telehealth: Payer: Self-pay | Admitting: Oncology

## 2015-05-28 ENCOUNTER — Ambulatory Visit: Admission: RE | Admit: 2015-05-28 | Payer: Medicare Other | Source: Ambulatory Visit | Admitting: Radiation Oncology

## 2015-05-28 NOTE — Telephone Encounter (Signed)
Left a message for [facility] regarding her follow up appointment with Dr. Pablo Ledger today.  Requested a return call.

## 2015-06-08 ENCOUNTER — Telehealth: Payer: Self-pay | Admitting: Adult Health

## 2015-06-08 NOTE — Telephone Encounter (Signed)
I left a voicemail for Ms. Amanda Schneider to return my call to schedule her survivorship clinic appt now that she has completed treatment for breast cancer.    I left my direct office number for Ms. Amanda Schneider to return my call.  We look forward to participating in her care.   Mike Craze, NP [place] [phone]

## 2015-06-16 ENCOUNTER — Other Ambulatory Visit: Payer: Self-pay | Admitting: Adult Health

## 2015-06-16 DIAGNOSIS — C50912 Malignant neoplasm of unspecified site of left female breast: Secondary | ICD-10-CM

## 2015-06-17 ENCOUNTER — Telehealth: Payer: Self-pay | Admitting: Hematology and Oncology

## 2015-06-17 NOTE — Telephone Encounter (Signed)
Amanda Schneider had placed a call and left a message for this patient about survivorship,an appointment has been made and a calendar and latter mailed to patient

## 2015-07-07 ENCOUNTER — Encounter: Payer: Medicare Other | Admitting: Nurse Practitioner

## 2015-07-08 ENCOUNTER — Telehealth: Payer: Self-pay | Admitting: *Deleted

## 2015-07-09 ENCOUNTER — Other Ambulatory Visit: Payer: Self-pay | Admitting: Nurse Practitioner

## 2015-07-09 ENCOUNTER — Telehealth: Payer: Self-pay | Admitting: *Deleted

## 2015-07-09 NOTE — Telephone Encounter (Signed)
RN called patient due to missed appointment and to see if she would like to reschedule. No answer/busy. Left patient a voicemail to return call.

## 2015-07-13 ENCOUNTER — Encounter: Payer: Self-pay | Admitting: Nurse Practitioner

## 2015-07-13 NOTE — Progress Notes (Signed)
The Survivorship Care Plan was mailed to Ms. Thome as she was unable to come in to the Survivorship Clinic for an in-person visit on July 07, 2015.  We have attempted to reschedule this visit, but have been unable to reach Ms. Terisa Starr.  A letter was mailed to her outlining the purpose of the content of the care plan, as well as encouraging her to reach out to me with any questions or concerns.  My business card was included in the correspondence to the patient as well.  A copy of the care plan was not routed to her PCP, as we have no PCP on record.   I will not be placing any follow-up appointments to the Survivorship Clinic for Ms. Terisa Starr, but I am happy to see her at any time in the future for any survivorship concerns that may arise. Thank you for allowing me to participate in her care!  Kenn File, [place] [phone]

## 2015-07-22 NOTE — Assessment & Plan Note (Deleted)
Left lumpectomy 02/03/2015: Grade 2 IDC, 2.2 cm with intermediate grade DCIS, 3 additional foci 1.3, 1, 0.9 cm DCIS close margins, 0/2 lymph nodes negative, T2 N0 stage II a, Re-excision margins clear. Oncotype DX score 0, 3% risk of recurrence;  Completed adjuvant radiation 04/23/2015, started anastrozole 1 mg daily 05/07/2015  Anastrozole toxicities:  Return to clinic in 6 months for follow-up

## 2015-07-23 ENCOUNTER — Ambulatory Visit: Payer: Medicare Other | Admitting: Hematology and Oncology

## 2015-08-19 ENCOUNTER — Other Ambulatory Visit: Payer: Self-pay | Admitting: Hematology and Oncology

## 2015-08-19 NOTE — Telephone Encounter (Signed)
Chart reviewed.

## 2015-10-01 ENCOUNTER — Other Ambulatory Visit: Payer: Self-pay

## 2015-10-01 ENCOUNTER — Telehealth: Payer: Self-pay | Admitting: Hematology and Oncology

## 2015-10-01 NOTE — Telephone Encounter (Signed)
New appointments made and a calendar has been mailed

## 2015-10-26 ENCOUNTER — Other Ambulatory Visit: Payer: Self-pay

## 2015-10-26 ENCOUNTER — Ambulatory Visit: Payer: Medicare Other | Admitting: Hematology and Oncology

## 2015-10-26 NOTE — Assessment & Plan Note (Signed)
Left lumpectomy 02/03/2015: Grade 2 IDC, 2.2 cm with intermediate grade DCIS, 3 additional foci 1.3, 1, 0.9 cm DCIS close margins, 0/2 lymph nodes negative, T2 N0 stage II a, Re-excision margins clear. Oncotype DX score 0, 3% risk of recurrence;  Completed adjuvant radiation 04/23/2015, started anastrozole 1 mg daily 05/07/2015  Anastrozole toxicities:  Return to clinic in 6 months for follow-up 

## 2015-10-27 ENCOUNTER — Telehealth: Payer: Self-pay | Admitting: Hematology and Oncology

## 2015-10-27 NOTE — Telephone Encounter (Signed)
Per 1/9 pof r/s missed f/u. Spoke with patient's dtr Dallana. Per Dallana appointments were not kept due to her mom returned to [place] and is not living in [place] and see a doctor there. Per dtr she did report this to our office. Left message for informing desk nurse - no appointment scheduled.

## 2016-02-17 IMAGING — CT CT HEAD W/O CM
2 series · 16 of 30 positions shown, 18 images · non-contrast
Comparison: None

CLINICAL DATA: Syncopal episode last night, weakness, hypertension,
diabetes

EXAM:
CT HEAD WITHOUT CONTRAST
TECHNIQUE: Contiguous axial images were obtained from the base of the skull
through the vertex without intravenous contrast.

[Series 201: head w/o, idose (1) · axial · non-contrast · 0.49mm/px · z∈[+119,+234]mm · 8 of 31 slices shown, 10 images]
[im 4/31  brain]
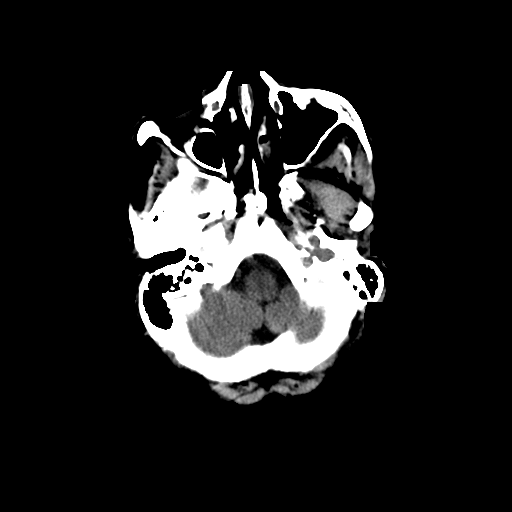
[im 4/31  bone]
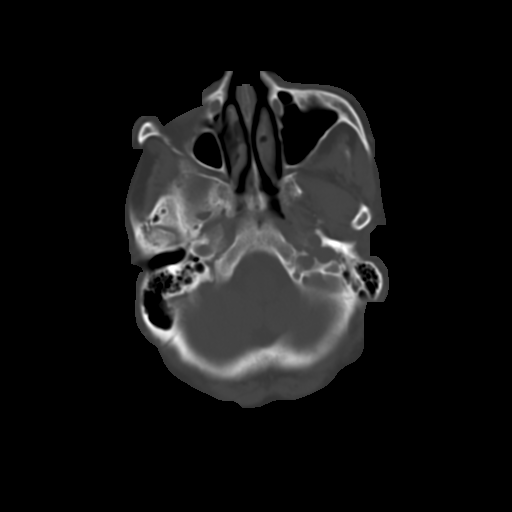
[im 7/31  brain]
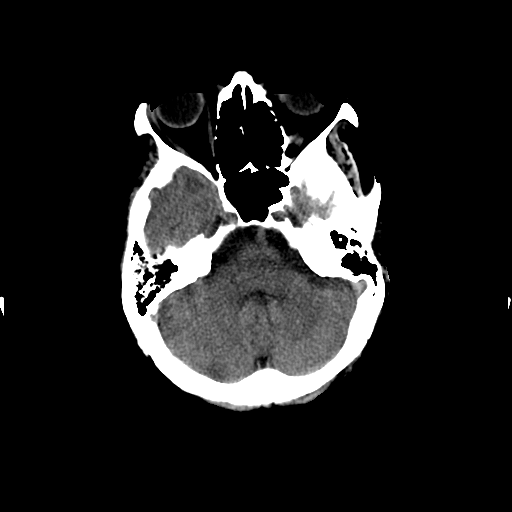
[im 11/31  brain]
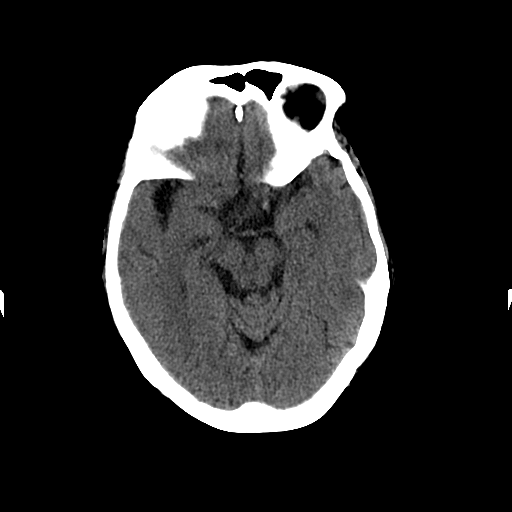
[im 14/31  brain]
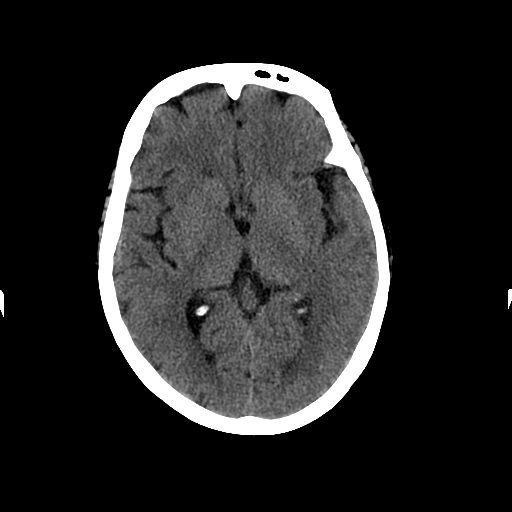
[im 17/31  brain]
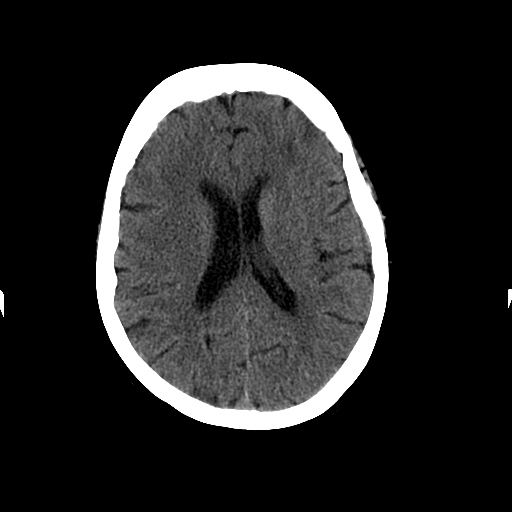
[im 17/31  bone]
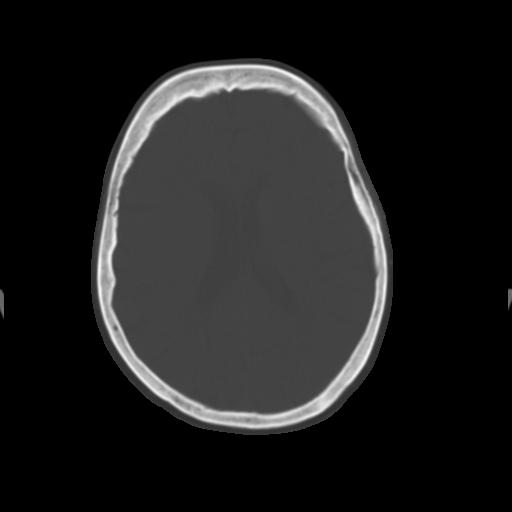
[im 21/31  brain]
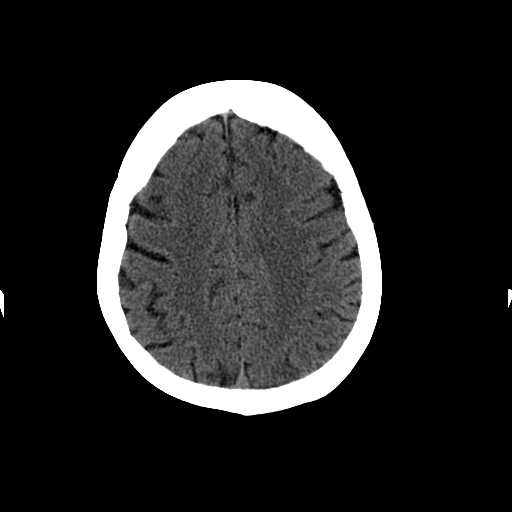
[im 24/31  brain]
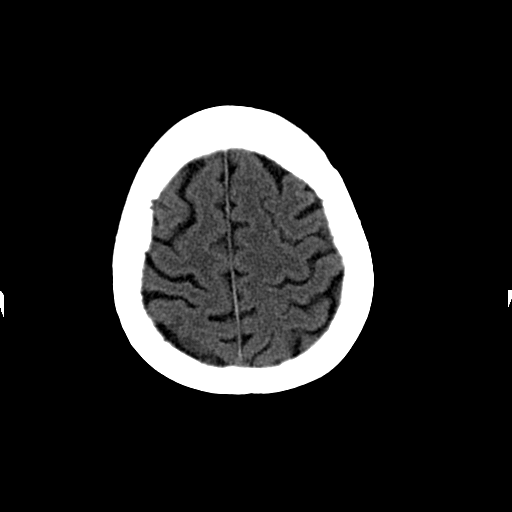
[im 27/31  brain]
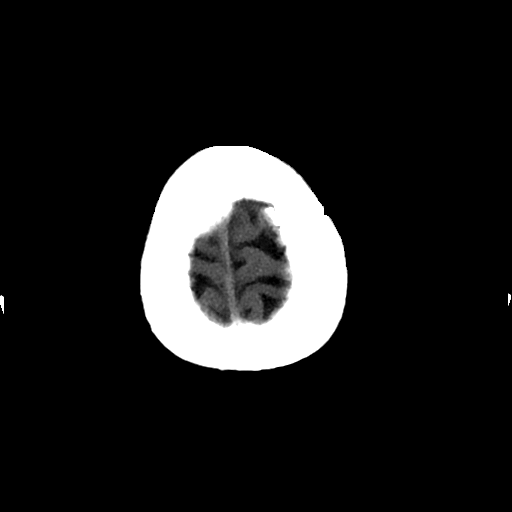

[Series 202: head w/o bone, idose (1) · axial · non-contrast · 0.49mm/px · z∈[+118,+238]mm · 8 of 62 slices shown]
[im 7/62  bone]
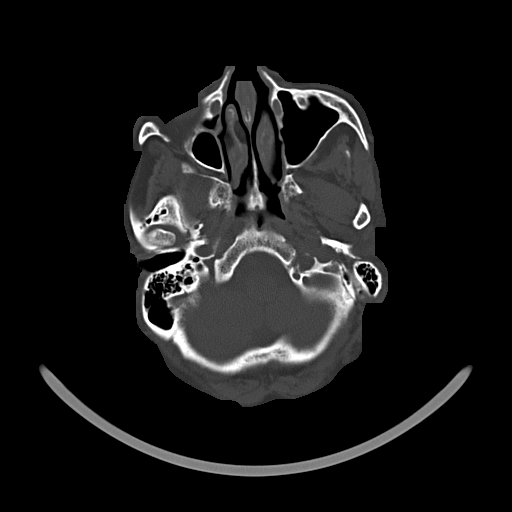
[im 13/62  bone]
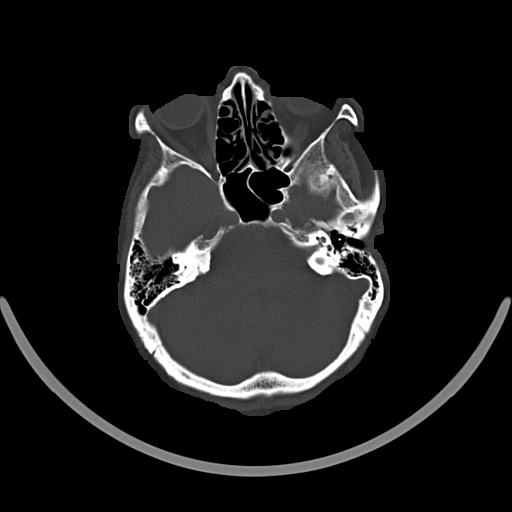
[im 20/62  bone]
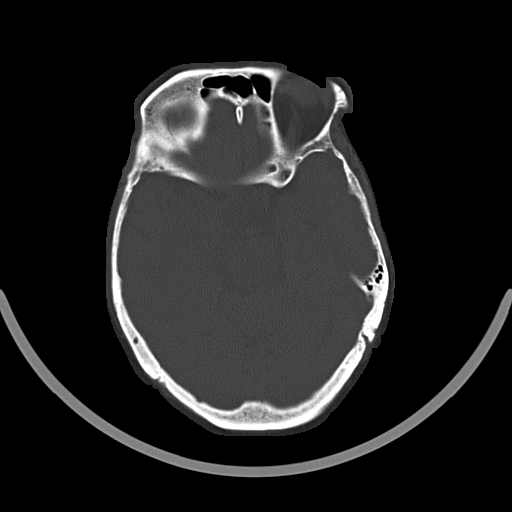
[im 26/62  bone]
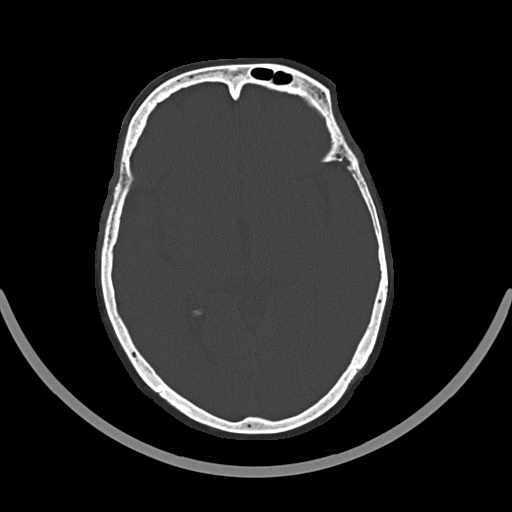
[im 36/62  bone]
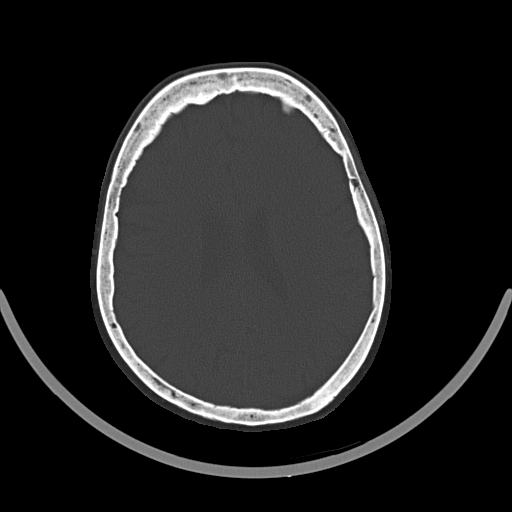
[im 42/62  bone]
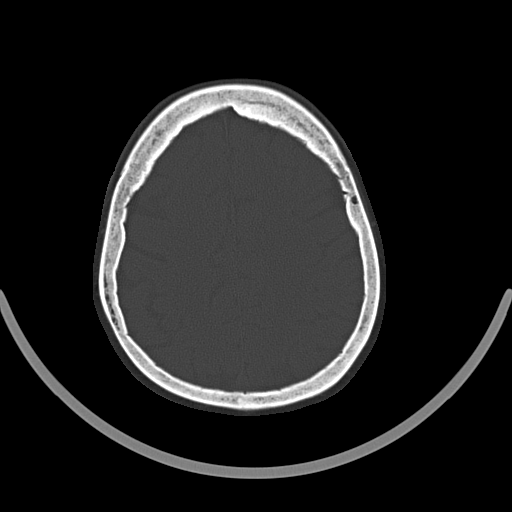
[im 49/62  bone]
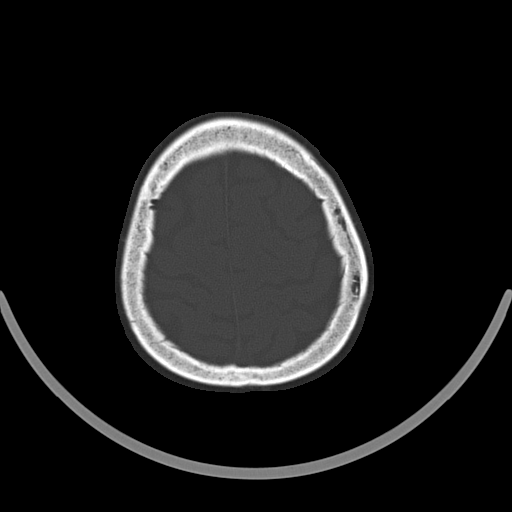
[im 55/62  bone]
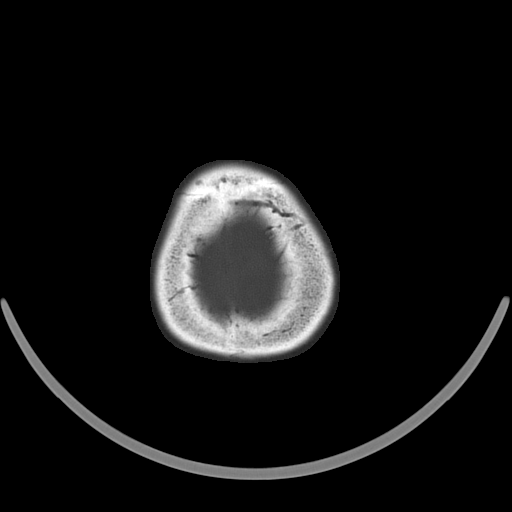

[16 of 30 positions shown; findings below may reference images not displayed]

FINDINGS: Generalized atrophy.

Normal ventricular morphology.

No midline shift or mass effect.

Mild minimal vessel chronic ischemic changes of deep cerebral white
matter.

No intracranial hemorrhage, mass lesion, or acute infarction.

Small amount of fluid in LEFT frontal sinus.

Visualized paranasal sinuses and mastoid air cells otherwise clear.

Bones unremarkable.
IMPRESSION: Minimal small vessel chronic ischemic changes of deep cerebral white
matter.

No acute intracranial abnormalities.

## 2020-02-26 ENCOUNTER — Encounter: Payer: Self-pay | Admitting: Internal Medicine

## 2020-03-31 ENCOUNTER — Ambulatory Visit: Payer: Medicare Other | Admitting: Internal Medicine

## 2020-10-07 ENCOUNTER — Emergency Department (HOSPITAL_COMMUNITY): Payer: Medicare Other

## 2020-10-07 ENCOUNTER — Encounter (HOSPITAL_COMMUNITY): Payer: Self-pay | Admitting: Internal Medicine

## 2020-10-07 ENCOUNTER — Other Ambulatory Visit: Payer: Self-pay

## 2020-10-07 ENCOUNTER — Inpatient Hospital Stay (HOSPITAL_COMMUNITY)
Admission: EM | Admit: 2020-10-07 | Discharge: 2020-10-08 | DRG: 640 | Disposition: A | Discharge status: Home / Self Care | Payer: Medicare Other | Attending: Internal Medicine | Admitting: Internal Medicine

## 2020-10-07 DIAGNOSIS — E871 Hypo-osmolality and hyponatremia: Principal | ICD-10-CM | POA: Diagnosis present

## 2020-10-07 DIAGNOSIS — U071 COVID-19: Secondary | ICD-10-CM

## 2020-10-07 DIAGNOSIS — N3001 Acute cystitis with hematuria: Secondary | ICD-10-CM | POA: Diagnosis present

## 2020-10-07 DIAGNOSIS — Z923 Personal history of irradiation: Secondary | ICD-10-CM

## 2020-10-07 DIAGNOSIS — T502X5A Adverse effect of carbonic-anhydrase inhibitors, benzothiadiazides and other diuretics, initial encounter: Secondary | ICD-10-CM | POA: Diagnosis present

## 2020-10-07 DIAGNOSIS — Z79899 Other long term (current) drug therapy: Secondary | ICD-10-CM

## 2020-10-07 DIAGNOSIS — N3090 Cystitis, unspecified without hematuria: Secondary | ICD-10-CM

## 2020-10-07 DIAGNOSIS — I951 Orthostatic hypotension: Secondary | ICD-10-CM | POA: Diagnosis present

## 2020-10-07 DIAGNOSIS — Z7984 Long term (current) use of oral hypoglycemic drugs: Secondary | ICD-10-CM

## 2020-10-07 DIAGNOSIS — Z66 Do not resuscitate: Secondary | ICD-10-CM | POA: Diagnosis present

## 2020-10-07 DIAGNOSIS — M810 Age-related osteoporosis without current pathological fracture: Secondary | ICD-10-CM | POA: Diagnosis present

## 2020-10-07 DIAGNOSIS — I10 Essential (primary) hypertension: Secondary | ICD-10-CM | POA: Diagnosis present

## 2020-10-07 DIAGNOSIS — Z79811 Long term (current) use of aromatase inhibitors: Secondary | ICD-10-CM

## 2020-10-07 DIAGNOSIS — R55 Syncope and collapse: Secondary | ICD-10-CM

## 2020-10-07 DIAGNOSIS — Z7983 Long term (current) use of bisphosphonates: Secondary | ICD-10-CM

## 2020-10-07 DIAGNOSIS — E119 Type 2 diabetes mellitus without complications: Secondary | ICD-10-CM | POA: Diagnosis present

## 2020-10-07 LAB — CBC WITH DIFFERENTIAL/PLATELET
Abs Immature Granulocytes: 0.03 10*3/uL (ref 0.00–0.07)
Basophils Absolute: 0 10*3/uL (ref 0.0–0.1)
Basophils Relative: 0 %
Eosinophils Absolute: 0 10*3/uL (ref 0.0–0.5)
Eosinophils Relative: 0 %
HCT: 36.6 % (ref 36.0–46.0)
Hemoglobin: 12.8 g/dL (ref 12.0–15.0)
Immature Granulocytes: 1 %
Lymphocytes Relative: 10 %
Lymphs Abs: 0.5 10*3/uL — ABNORMAL LOW (ref 0.7–4.0)
MCH: 30 pg (ref 26.0–34.0)
MCHC: 35 g/dL (ref 30.0–36.0)
MCV: 85.9 fL (ref 80.0–100.0)
Monocytes Absolute: 0.4 10*3/uL (ref 0.1–1.0)
Monocytes Relative: 8 %
Neutro Abs: 4.2 10*3/uL (ref 1.7–7.7)
Neutrophils Relative %: 81 %
Platelets: 265 10*3/uL (ref 150–400)
RBC: 4.26 MIL/uL (ref 3.87–5.11)
RDW: 12.8 % (ref 11.5–15.5)
WBC: 5.2 10*3/uL (ref 4.0–10.5)
nRBC: 0 % (ref 0.0–0.2)

## 2020-10-07 LAB — URINALYSIS, ROUTINE W REFLEX MICROSCOPIC
Bilirubin Urine: NEGATIVE
Glucose, UA: NEGATIVE mg/dL
Ketones, ur: NEGATIVE mg/dL
Nitrite: POSITIVE — AB
Protein, ur: NEGATIVE mg/dL
Specific Gravity, Urine: 1.009 (ref 1.005–1.030)
WBC, UA: >50 WBC/hpf — ABNORMAL HIGH (ref 0–5)
pH: 6 (ref 5.0–8.0)

## 2020-10-07 LAB — HEMOGLOBIN A1C
Hgb A1c MFr Bld: 6.1 % — ABNORMAL HIGH (ref 4.8–5.6)
Mean Plasma Glucose: 128.37 mg/dL

## 2020-10-07 LAB — BASIC METABOLIC PANEL
Anion gap: 13 (ref 5–15)
BUN: 18 mg/dL (ref 8–23)
CO2: 21 mmol/L — ABNORMAL LOW (ref 22–32)
Calcium: 9.5 mg/dL (ref 8.9–10.3)
Chloride: 87 mmol/L — ABNORMAL LOW (ref 98–111)
Creatinine, Ser: 0.9 mg/dL (ref 0.44–1.00)
GFR, Estimated: >60 mL/min (ref >60)
Glucose, Bld: 130 mg/dL — ABNORMAL HIGH (ref 70–99)
Potassium: 4.4 mmol/L (ref 3.5–5.1)
Sodium: 121 mmol/L — ABNORMAL LOW (ref 135–145)

## 2020-10-07 LAB — RESP PANEL BY RT-PCR (FLU A&B, COVID) ARPGX2
Influenza A by PCR: NEGATIVE
Influenza B by PCR: NEGATIVE
SARS Coronavirus 2 by RT PCR: POSITIVE — AB

## 2020-10-07 LAB — MAGNESIUM: Magnesium: 1.6 mg/dL — ABNORMAL LOW (ref 1.7–2.4)

## 2020-10-07 LAB — TROPONIN I (HIGH SENSITIVITY): Troponin I (High Sensitivity): 3 ng/L (ref <18)

## 2020-10-07 MED ORDER — ALBUTEROL SULFATE HFA 108 (90 BASE) MCG/ACT IN AERS
2.0000 | INHALATION_SPRAY | Freq: Once | RESPIRATORY_TRACT | Status: DC | PRN
  Filled 2020-10-07: qty 6.7

## 2020-10-07 MED ORDER — SODIUM CHLORIDE 0.9 % IV BOLUS
1000.0000 mL | Freq: Once | INTRAVENOUS | Status: AC
  Administered 2020-10-07: 1000 mL via INTRAVENOUS

## 2020-10-07 MED ORDER — SODIUM CHLORIDE 0.9 % IV SOLN: INTRAVENOUS | Status: DC | PRN

## 2020-10-07 MED ORDER — MAGNESIUM SULFATE 2 GM/50ML IV SOLN
2.0000 g | Freq: Once | INTRAVENOUS | Status: AC
  Administered 2020-10-07: 21:00:00 2 g via INTRAVENOUS
  Filled 2020-10-07: qty 50

## 2020-10-07 MED ORDER — SODIUM CHLORIDE 0.9 % IV BOLUS
1000.0000 mL | Freq: Once | INTRAVENOUS | Status: AC
  Administered 2020-10-07: 21:00:00 1000 mL via INTRAVENOUS

## 2020-10-07 MED ORDER — SODIUM CHLORIDE 0.9 % IV SOLN
Freq: Once | INTRAVENOUS | Status: AC
  Filled 2020-10-07: qty 20

## 2020-10-07 MED ORDER — CALCIUM CARBONATE-VITAMIN D 500-200 MG-UNIT PO TABS
1.0000 | ORAL_TABLET | Freq: Every day | ORAL | Status: DC
  Administered 2020-10-08: 11:00:00 1 via ORAL
  Filled 2020-10-07 (×2): qty 1

## 2020-10-07 MED ORDER — METHYLPREDNISOLONE SODIUM SUCC 125 MG IJ SOLR: 125.0000 mg | Freq: Once | INTRAMUSCULAR | Status: DC | PRN

## 2020-10-07 MED ORDER — ENOXAPARIN SODIUM 40 MG/0.4ML ~~LOC~~ SOLN
40.0000 mg | SUBCUTANEOUS | Status: DC
  Administered 2020-10-08: 40 mg via SUBCUTANEOUS
  Filled 2020-10-07: qty 0.4

## 2020-10-07 MED ORDER — ACETAMINOPHEN 650 MG RE SUPP: 650.0000 mg | Freq: Four times a day (QID) | RECTAL | Status: DC | PRN

## 2020-10-07 MED ORDER — EPINEPHRINE 0.3 MG/0.3ML IJ SOAJ
0.3000 mg | Freq: Once | INTRAMUSCULAR | Status: DC | PRN
  Filled 2020-10-07: qty 0.6

## 2020-10-07 MED ORDER — POLYETHYLENE GLYCOL 3350 17 G PO PACK: 17.0000 g | PACK | Freq: Every day | ORAL | Status: DC | PRN

## 2020-10-07 MED ORDER — DIPHENHYDRAMINE HCL 50 MG/ML IJ SOLN: 50.0000 mg | Freq: Once | INTRAMUSCULAR | Status: DC | PRN

## 2020-10-07 MED ORDER — INSULIN ASPART 100 UNIT/ML ~~LOC~~ SOLN: 0.0000 [IU] | Freq: Three times a day (TID) | SUBCUTANEOUS | Status: DC

## 2020-10-07 MED ORDER — ACETAMINOPHEN 325 MG PO TABS: 650.0000 mg | ORAL_TABLET | Freq: Four times a day (QID) | ORAL | Status: DC | PRN

## 2020-10-07 MED ORDER — LOSARTAN POTASSIUM 50 MG PO TABS
50.0000 mg | ORAL_TABLET | Freq: Every day | ORAL | Status: DC
Start: 2020-10-07 — End: 2020-10-08
  Administered 2020-10-08 (×2): 50 mg via ORAL
  Filled 2020-10-07 (×2): qty 1

## 2020-10-07 MED ORDER — SODIUM CHLORIDE 0.9 % IV SOLN
1.0000 g | Freq: Once | INTRAVENOUS | Status: AC
  Administered 2020-10-07: 21:00:00 1 g via INTRAVENOUS
  Filled 2020-10-07: qty 10

## 2020-10-07 MED ORDER — CALCIUM CARBONATE-VITAMIN D 250-125 MG-UNIT PO TABS: 1.0000 | ORAL_TABLET | Freq: Every day | ORAL | Status: DC

## 2020-10-07 MED ORDER — PANTOPRAZOLE SODIUM 40 MG PO TBEC
40.0000 mg | DELAYED_RELEASE_TABLET | Freq: Every day | ORAL | Status: DC
  Administered 2020-10-08 (×2): 40 mg via ORAL
  Filled 2020-10-07 (×2): qty 1

## 2020-10-07 MED ORDER — FAMOTIDINE IN NACL 20-0.9 MG/50ML-% IV SOLN
20.0000 mg | Freq: Once | INTRAVENOUS | Status: DC | PRN
  Filled 2020-10-07: qty 50

## 2020-10-07 NOTE — ED Notes (Signed)
Admitting provider discontinued bolus. Pt received 500 mL prior to discontinuing medication. BNP stat collected and sent to lab

## 2020-10-07 NOTE — Progress Notes (Signed)
Pharmacy COVID-19 Monoclonal Antibody Screening  Baileigh Modisette was identified as being not hospitalized with symptoms from Covid-19 on admission but an incidental positive PCR has been documented.  The patient may qualify for the use of monoclonal antibodies (mAB) for COVID-19 viral infection to prevent worsening symptoms stemming from Covid-19 infection.  The patient was identified based on a positive COVID-19 PCR and not requiring the use of supplemental oxygen at this time.  This patient meets the FDA criteria for Emergency Use Authorization of casirivimab/imdevimab or bamlanivimab/etesevimab.  Has a (+) direct SARS-CoV-2 viral test result  Is NOT hospitalized due to COVID-19  Is within 10 days of symptom onset  Has at least one of the high risk factor(s) for progression to severe COVID-19 and/or hospitalization as defined in EUA.  Specific high risk criteria : Older age (>/= 83 yo) and Diabetes  Additionally: The patient has not had a positive COVID-19 PCR in the last 90 days.  The patient is fully vaccinated against COVID-19.  Since the patient is partially or fully vaccinated for COVID-19, is asymptomatic with a cycle time of < 32, and meets high risk criteria, the patient is eligible for mAB administration.    Plan: Based on the above discussion, it was decided that the patient will receive one dose of the available COVID-19 mAB combination. Pharmacy will coordinate administration timing with patient's nurse. Recommended infusion monitoring parameters communicated to the nursing team.  Albertina Parr, PharmD., BCPS, BCCCP Clinical Pharmacist Please refer to [facility] for unit-specific pharmacist

## 2020-10-07 NOTE — ED Triage Notes (Signed)
Pt BIB [facility] from home for a syncopal episode lasting approx 2 mins, pt was sitting in a chair during the episode, did not fall or hit or head, loss of bladder. No hx of seizures. No oral injury, no tremors or shaking during event. Recent change to BP meds.  BP 126/68 HR 74 RR 16 97.3 temp CBG 145  Left arm restriction d/t breast cancer   22g RFA

## 2020-10-07 NOTE — H&P (Addendum)
Date: 10/07/2020               Patient Name:  Amanda Schneider MRN: [id number]  DOB: 27-Oct-1934 Age / Sex: 85 y.o., female   PCP:          Medical Service: Internal Medicine Teaching Service         Attending Physician: Dr. Dareen Piano    First Contact: Dr. Wynetta Emery Pager: [phone]  Second Contact: Dr. Charleen Kirks Pager: [phone]       After Hours (After 5p/  First Contact Pager: [phone]  weekends / holidays): Second Contact Pager: [phone]   Chief Complaint: syncope  History of Present Illness:   Amanda Schneider is a 85 y.o. Spanish speaking female with hx of HTN, stage II ductal carcinoma s/p lumpectomy and radiation and anastrazole, DM, osteoporosis presenting for syncopal episode around noon today. Daughter is translating and helping with history. States that her mother was eating today when she started feeling generalized weakness, then lost consciousness. States her head bowed over and rested on her arm, her eyes were open, and she was unresponsive for about 2 minutes. Over the next 5 minutes she began moaning and gradually came back to her baseline. Denies dizziness, CP, palpitations, SOB, cough. No shaking or motor abnormalities, no incontinence, no tongue biting. Does note mild pain to her L rib area prior to passing out, which has resolved. She has had 2 similar episodes previously, last in 2016 which was attributed to most likely orthostatics. She has complained of intermittent dizziness roughly once a week recently, which resolved with laying down. Daughter states she may not drink enough water. Of note, she was on 12/8 started on hctz (in addition to losartan) and told to go on a low salt diet by her PCP. Na of 121 today. At her previous episode in 2016 she had also been on hctz and had Na of 122 on presentation which corrected with normal saline. These episodes and her dizziness occurred while eating, or immediately after eating. She notes a sour taste in her mouth which was present in  the 2 previous episodes as well. At this time she feels at her baseline. Reports suprapubic pain upon urination, urinary frequency which is baseline for her, and I note a malodorous smell after she used the commode.  COVID-19 test positive in ED. She had 2 doses of vaccine, no booster. No known sick contacts. Endorses runny nose, congestion and sore throat starting 4 days ago but otherwise no symptoms. Denies fever, chills, SOB, cough, n/v/d.   ED Course: UA positive for nitrites and bacteria, started on Rocephin for presumed UTI.  Hyponatremia with Na of 121, last Na 5 months ago was normal. 1L NS bolus was administered and another was started and stopped after about half. COVID test was positive.  Current Outpatient Medications  Medication Instructions  . acetaminophen (TYLENOL) 325-650 mg, Oral, Every 6 hours PRN  . alendronate (FOSAMAX) 70 mg, Oral, Weekly, Take with a full glass of water on an empty stomach.  Marland Kitchen anastrozole (ARIMIDEX) 1 MG tablet TAKE 1 TABLET(1 MG) BY MOUTH DAILY  . Calcium Carb-Cholecalciferol (CALCIUM + D3 PO) 1 tablet, Oral, Daily with breakfast  . calcium carbonate (ANTACID) 750 MG chewable tablet 1 tablet, Oral, As needed  . HYDROcodone-acetaminophen (NORCO) 5-325 MG per tablet 1-2 tablets, Oral, Every 4 hours PRN  . linagliptin (TRADJENTA) 5 mg, Oral, Daily  . losartan (COZAAR) 50 mg, Oral, Daily  . losartan (COZAAR) 100 mg, Oral,  Daily  . metFORMIN (GLUCOPHAGE) 1,000 mg, Oral, 2 times daily with meals  . non-metallic deodorant (ALRA) MISC 1 application, Topical, Daily  . raloxifene (EVISTA) 60 mg, Oral, Daily    Allergies as of 10/07/2020 - Review Complete 10/07/2020  Allergen Reaction Noted  . Hydrochlorothiazide Other (See Comments) 10/07/2020    Past Medical History:  Diagnosis Date  . Anxiety   . Arthritis    "knees, arms" (12/15/2014)  . Cancer ([place])   . Depression   . GERD (gastroesophageal reflux disease)   . Hypertension   . Left breast mass     "just noticed it ~ 3 wks ago" (12/15/2014)  . Osteoporosis   . Radiation 04/01/15-04/23/15   Left Breast 21 fractions/42.72 Gy  . Seizures ([place]) 12/14/2014   "just the one on 12/14/2014) (12/15/2014) due to low sodium  . Type II diabetes mellitus ([place])     Past Surgical History:  Procedure Laterality Date  . BREAST LUMPECTOMY WITH SENTINEL LYMPH NODE BIOPSY Left 02/03/2015   Procedure: LEFT BREAST LUMPECTOMY WITH SENTINEL LYMPH NODE BX;  Surgeon: Coralie Keens, MD;  Location: [facility];  Service: General;  Laterality: Left;  . CERVICAL BIOPSY    . RE-EXCISION OF BREAST LUMPECTOMY Left 03/02/2015   Procedure: RE-EXCISION OF LEFT BREAST CANCER;  Surgeon: Coralie Keens, MD;  Location: [facility];  Service: General;  Laterality: Left;    Family History  Problem Relation Age of Onset  . Cancer Mother        unknown type  . Seizures Grandson   . Heart attack Neg Hx     Social History   Tobacco Use  . Smoking status: Never Smoker  . Smokeless tobacco: Never Used  Substance Use Topics  . Alcohol use: No    Alcohol/week: 0.0 standard drinks  . Drug use: No   Lives at home with her daughter and her daughter's family. She is very independent, completes all her ADLs.   Review of Systems: Review of Systems  Constitutional: Negative for chills, fever and weight loss.  HENT: Positive for congestion and sore throat.   Eyes: Positive for pain. Negative for blurred vision and double vision.  Respiratory: Negative for cough and shortness of breath.   Cardiovascular: Negative for chest pain and palpitations.  Gastrointestinal: Positive for constipation. Negative for abdominal pain, blood in stool, diarrhea, nausea and vomiting.  Genitourinary: Positive for dysuria and frequency (baseline).       +malodorous  Musculoskeletal: Positive for joint pain (chronic arthritis).  Skin: Negative for itching and rash.  Neurological: Positive for loss of consciousness  and weakness. Negative for dizziness, sensory change, speech change, focal weakness and headaches.  Psychiatric/Behavioral: Negative for depression. The patient is not nervous/anxious.      Physical Exam: Blood pressure 135/76, pulse 90, temperature 98.6 F (37 C), temperature source Oral, resp. rate (!) 24, height 5\' 1"  (1.549 m), weight 62.6 kg, SpO2 98 %. Physical Exam Vitals and nursing note reviewed.  Constitutional:      General: She is not in acute distress.    Appearance: Normal appearance. She is not ill-appearing.  HENT:     Head: Normocephalic and atraumatic.     Right Ear: External ear normal.     Left Ear: External ear normal.     Nose: Nose normal. No rhinorrhea.     Mouth/Throat:     Mouth: Mucous membranes are moist.     Pharynx: No oropharyngeal exudate or posterior oropharyngeal erythema.  Eyes:     General: No scleral icterus.    Extraocular Movements: Extraocular movements intact.     Conjunctiva/sclera: Conjunctivae normal.     Pupils: Pupils are equal, round, and reactive to light.  Cardiovascular:     Rate and Rhythm: Normal rate and regular rhythm.     Heart sounds: Normal heart sounds. No murmur heard. No friction rub. No gallop.   Pulmonary:     Effort: Pulmonary effort is normal. No respiratory distress.     Breath sounds: Normal breath sounds. No stridor. No wheezing, rhonchi or rales.  Abdominal:     General: Abdomen is flat.     Palpations: Abdomen is soft.     Tenderness: There is no abdominal tenderness. There is no guarding.  Musculoskeletal:        General: Normal range of motion.     Cervical back: Normal range of motion and neck supple.     Right lower leg: No edema.     Left lower leg: No edema.  Skin:    General: Skin is warm and dry.  Neurological:     General: No focal deficit present.     Mental Status: She is alert and oriented to person, place, and time.     Cranial Nerves: No cranial nerve deficit.     Sensory: No sensory  deficit.     Motor: No weakness.  Psychiatric:        Mood and Affect: Mood normal.        Behavior: Behavior normal.      EKG: personally reviewed my interpretation is NSR, preexisting T wave inversions, no ST elevation  CXR: personally reviewed my interpretation is low lung volumes, otherwise no acute findings  Assessment & Plan by Problem: Active Problems:   Hyponatremia   Amanda Schneider is a 85 y.o. female with hx of hx of HTN, stage II ductal carcinoma s/p lumpectomy and radiation and anastrazole, DM, osteoporosis presenting for syncopal episode without clear seizure-like activity. Found to by hyponatremic likely 2/2 hctz and sodium restricted diet. Also UTI and COVID positive.  Syncopal episode DDx includes orthostatic vs vasovagal vs hyponatremic seizure. Had a postictal phase, but no clear motor movements. Less likely cardiac or other neurologic in origin given presenting symptoms. Has had 2 prior episodes which sound very similar, the one in our EMR was also in the setting of hyponatremia 2/2 hctz use. Also note that prior episodes occurred while or soon after eating. -f/u orthostatic vital signs -management of hyponatremia and UTI as below  Moderate hyponatremia in setting of recently starting hctz and low sodium diet Had similar presentation in 2016 after starting hctz, which should have been avoided indefinitely but patient lived in [place] for some time in between, so care has been fragmented. Goal Na of 125-127 by ~1500 tomorrow. Fluids were started in ED for suspected dehydration, so she is s/p 1.5L NS bolus. Repeat BMP on 128 collected at 2317. -hold off on fluid restriction and IVF for now given slight overcorrection at this time -f/u repeat BMP in 2 hours -f/u serum osm, urine osm, urine Na  Acute simple cystitis May have contributed if syncope was orthostatic. Endorses suprapubic pain when urinating over past few days, and chronic urinary frequency. No  leukocytosis. -Ceftriaxone started in ED -daily CBC  COVID infection COVID test positive. Asymptomatic vs mildly symptomatic with congestion, rhinorrhea, sore throat. Had 2 doses of vaccine. Saturating 100% on room air. With comorbidities of DM and advanced age,  is a candidate for mAb infusion which she agrees to. -start bamlanivimab/etesevimab -monitor oxygenation  HTN BP of 117/50 on admission. Home meds of losartan 50mg  and hctz which has been discontinued due to hyponatremia. -continue home losartan  DM A1c of 6.1. Home meds of metformin and linagliptin. -start SSI  Osteoporosis  Home meds of alendronate and Ca-vit D. -continue Ca-VitD  Dispo: Admit patient to Inpatient with expected length of stay greater than 2 midnights.  Signed: Andrew Au, MD 10/07/2020, 11:24 PM  Pager: [phone]  After 5pm on weekdays and 1pm on weekends: On Call pager: [phone]

## 2020-10-07 NOTE — ED Provider Notes (Signed)
[facility] EMERGENCY DEPARTMENT Provider Note   CSN: [id number] Arrival date & time: 10/07/20  1438     History Chief Complaint  Patient presents with  . Near Syncope    Amanda Schneider is a 85 y.o. female with a history of hypertension, near syncope, stage II ductal carcinoma, presenting to the emergency department with episode of near syncope.  The patient's primarily Spanish-speaking.  Supplemental history is provided by her daughter, whom she lives with, and helps interpret at bedside.  Patient reportedly been feeling "unwell" throughout the morning today.  She describes the pain in her left side, and general sense of fatigue and weakness.  Around 230 she was sitting in the kitchen, eating soup for lunch, and began to feel lightheaded.  Her daughter who was in the room noted the patient leaned forward and then seem to slump over the table.  The patient was breathing on her own her eyes were open, but was not verbalizing.  There was no tonic-clonic activity or generalized shaking.  Daughter called 8, and while talking to the operator, the patient gradually came to.  Her daughter believes the patient was "unresponsive" for about 2 minutes.  Afterwards the patient moaned and was able to lift her arms and follow commands, and verbalized within 5 minutes.  Her daughter reports the patient has had 2 similar episodes in the past, both at rest.  She has seen a cardiologist several years ago, per medical record review had an echocardiogram done 2016 which showed a normal EF.  Her daughter reports the patient was recently started on a new blood pressure medicine for hypertension.  This was a combination losartan-HCTZ med.  She did not take her morning dose today.   Both the patient and her daughter state the patient is not drinking of water at home.  Her daughter also reports the patient is complained of side pain in the past.  She thinks this may be positional as the patient does  a lot of crocheting, and she is often bent or twisted at an angle.  Patient has received 2 doses of the Covid vaccine several months ago but has not received the booster.  She denies any recent fevers, chills, cough, congestion, URI symptoms.  Currently the emergency department patient reports she is asymptomatic.  She denies lightheadedness or fatigue.  She denies any chest pain here or earlier.   HPI     Past Medical History:  Diagnosis Date  . Anxiety   . Arthritis    "knees, arms" (12/15/2014)  . Cancer   . Depression   . GERD (gastroesophageal reflux disease)   . Hypertension   . Left breast mass    "just noticed it ~ 3 wks ago" (12/15/2014)  . Osteoporosis   . Radiation 04/01/15-04/23/15   Left Breast 21 fractions/42.72 Gy  . Seizures 12/14/2014   "just the one on 12/14/2014) (12/15/2014) due to low sodium  . Type II diabetes mellitus     Patient Active Problem List   Diagnosis Date Noted  . Osteoporosis 04/22/2015  . Breast cancer, left breast ([place]) 01/20/2015  . Syncope and collapse 12/15/2014  . Essential hypertension 12/15/2014  . Dehydration 12/15/2014  . Hyponatremia 12/15/2014  . DM II (diabetes mellitus, type II), controlled ([place]) 12/15/2014  . Left breast lump 12/15/2014    Past Surgical History:  Procedure Laterality Date  . BREAST LUMPECTOMY WITH SENTINEL LYMPH NODE BIOPSY Left 02/03/2015   Procedure: LEFT BREAST LUMPECTOMY WITH SENTINEL LYMPH  NODE BX;  Surgeon: Coralie Keens, MD;  Location: [facility];  Service: General;  Laterality: Left;  . CERVICAL BIOPSY    . RE-EXCISION OF BREAST LUMPECTOMY Left 03/02/2015   Procedure: RE-EXCISION OF LEFT BREAST CANCER;  Surgeon: Coralie Keens, MD;  Location: [facility];  Service: General;  Laterality: Left;     OB History   No obstetric history on file.     No family history on file.  Social History   Tobacco Use  . Smoking status: Never Smoker  . Smokeless tobacco: Never  Used  Substance Use Topics  . Alcohol use: No    Alcohol/week: 0.0 standard drinks  . Drug use: No    Home Medications Prior to Admission medications   Medication Sig Start Date End Date Taking? Authorizing Provider  acetaminophen (TYLENOL) 325 MG tablet Take 325-650 mg by mouth every 6 (six) hours as needed for mild pain or headache.   Yes [provider]  Calcium Carb-Cholecalciferol (CALCIUM + D3 PO) Take 1 tablet by mouth daily with breakfast.   Yes [provider]  calcium carbonate (ANTACID) 750 MG chewable tablet Chew 1 tablet by mouth as needed for heartburn (or acid reflux).   Yes [provider]  linagliptin (TRADJENTA) 5 MG TABS tablet Take 5 mg by mouth daily.   Yes [provider]  losartan (COZAAR) 100 MG tablet Take 100 mg by mouth daily.   Yes [provider]  metFORMIN (GLUCOPHAGE) 1000 MG tablet Take 1,000 mg by mouth 2 (two) times daily with a meal.   Yes [provider]  non-metallic deodorant Jethro Poling) MISC Apply 1 application topically daily.   Yes [provider]  raloxifene (EVISTA) 60 MG tablet Take 60 mg by mouth daily.   Yes [provider]  alendronate (FOSAMAX) 70 MG tablet Take 1 tablet (70 mg total) by mouth once a week. Take with a full glass of water on an empty stomach. Patient not taking: No sig reported 04/22/15   Nicholas Lose, MD  anastrozole (ARIMIDEX) 1 MG tablet TAKE 1 TABLET(1 MG) BY MOUTH DAILY Patient not taking: No sig reported 08/19/15   Nicholas Lose, MD  HYDROcodone-acetaminophen (NORCO) 5-325 MG per tablet Take 1-2 tablets by mouth every 4 (four) hours as needed. Patient not taking: No sig reported 02/03/15   Coralie Keens, MD  losartan (COZAAR) 50 MG tablet Take 1 tablet (50 mg total) by mouth daily. Patient not taking: Reported on 10/07/2020 12/16/14   Geradine Girt, DO    Allergies    Hydrochlorothiazide  Review of Systems   Review of Systems  Constitutional:  Negative for chills and fever.  Eyes: Negative for photophobia and visual disturbance.  Respiratory: Negative for cough and shortness of breath.   Cardiovascular: Negative for chest pain and palpitations.  Gastrointestinal: Positive for nausea. Negative for abdominal pain and vomiting.  Genitourinary: Negative for dysuria and hematuria.  Musculoskeletal: Positive for arthralgias. Negative for myalgias.  Skin: Negative for color change and rash.  Neurological: Positive for dizziness, syncope and light-headedness. Negative for seizures, facial asymmetry, weakness and headaches.  All other systems reviewed and are negative.   Physical Exam Updated Vital Signs BP 135/76   Pulse 90   Temp 98.6 F (37 C) (Oral)   Resp (!) 24   Ht 5\' 1"  (1.549 m)   Wt 62.6 kg   SpO2 98%   BMI 26.07 kg/m   Physical Exam Vitals and nursing note reviewed.  Constitutional:      General: She is not in acute distress.    Appearance: She is well-developed and well-nourished.  HENT:     Head: Normocephalic and atraumatic.  Eyes:     Conjunctiva/sclera: Conjunctivae normal.  Cardiovascular:     Rate and Rhythm: Normal rate and regular rhythm.     Pulses: Normal pulses.     Heart sounds: No murmur heard.   Pulmonary:     Effort: Pulmonary effort is normal. No respiratory distress.  Abdominal:     General: There is no distension.     Palpations: Abdomen is soft.     Tenderness: There is no abdominal tenderness. There is no guarding.  Musculoskeletal:        General: No edema.     Cervical back: Neck supple.  Skin:    General: Skin is warm and dry.  Neurological:     General: No focal deficit present.     Mental Status: She is alert and oriented to person, place, and time.     Sensory: No sensory deficit.     Motor: No weakness.  Psychiatric:        Mood and Affect: Mood and affect and mood normal.        Behavior: Behavior normal.     ED Results / Procedures / Treatments   Labs (all labs  ordered are listed, but only abnormal results are displayed) Labs Reviewed  RESP PANEL BY RT-PCR (FLU A&B, COVID) ARPGX2 - Abnormal; Notable for the following components:      Result Value   SARS Coronavirus 2 by RT PCR POSITIVE (*)    All other components within normal limits  BASIC METABOLIC PANEL - Abnormal; Notable for the following components:   Sodium 121 (*)    Chloride 87 (*)    CO2 21 (*)    Glucose, Bld 130 (*)    All other components within normal limits  CBC WITH DIFFERENTIAL/PLATELET - Abnormal; Notable for the following components:   Lymphs Abs 0.5 (*)    All other components within normal limits  MAGNESIUM - Abnormal; Notable for the following components:   Magnesium 1.6 (*)    All other components within normal limits  URINALYSIS, ROUTINE W REFLEX MICROSCOPIC - Abnormal; Notable for the following components:   APPearance HAZY (*)    Hgb urine dipstick SMALL (*)    Nitrite POSITIVE (*)    Leukocytes,Ua MODERATE (*)    WBC, UA >50 (*)    Bacteria, UA MANY (*)    All other components within normal limits  URINE CULTURE  BASIC METABOLIC PANEL  CBC  OSMOLALITY  OSMOLALITY, URINE  BASIC METABOLIC PANEL  BASIC METABOLIC PANEL  BASIC METABOLIC PANEL  BASIC METABOLIC PANEL  BASIC METABOLIC PANEL  HEMOGLOBIN A1C  TROPONIN I (HIGH SENSITIVITY)  TROPONIN I (HIGH SENSITIVITY)    EKG EKG Interpretation  Date/Time:  Wednesday October 07 2020 14:55:21 EST Ventricular Rate:  79 PR Interval:  148 QRS Duration: 82 QT Interval:  390 QTC Calculation: 447 R Axis:   -45 Text Interpretation: Normal sinus rhythm Low voltage QRS Left anterior fascicular block Cannot rule out Anterior infarct , age undetermined Abnormal ECG No significant change from Feb 2016 ecg, No STEMI Confirmed by Octaviano Glow [phone]) on 10/07/2020 3:01:09 PM   Radiology DG Chest 2 View  Result Date: 10/07/2020 CLINICAL DATA:  Near syncope EXAM: CHEST - 2 VIEW COMPARISON:  12/15/2014 FINDINGS:  The heart size and mediastinal contours  are within normal limits. Low lung volumes. No focal airspace consolidation, pleural effusion, or pneumothorax. The visualized skeletal structures are unremarkable. IMPRESSION: Low lung volumes. No acute cardiopulmonary disease. Electronically Signed   By: Davina Poke D.O.   On: 10/07/2020 15:40    Procedures Procedures (including critical care time)  Medications Ordered in ED Medications  losartan (COZAAR) tablet 50 mg (has no administration in time range)  pantoprazole (PROTONIX) EC tablet 40 mg (has no administration in time range)  enoxaparin (LOVENOX) injection 40 mg (has no administration in time range)  acetaminophen (TYLENOL) tablet 650 mg (has no administration in time range)    Or  acetaminophen (TYLENOL) suppository 650 mg (has no administration in time range)  polyethylene glycol (MIRALAX / GLYCOLAX) packet 17 g (has no administration in time range)  calcium-vitamin D (OSCAL WITH D) 500-200 MG-UNIT per tablet 1 tablet (has no administration in time range)  insulin aspart (novoLOG) injection 0-9 Units (has no administration in time range)  bamlanivimab 700 mg, etesevimab 1,400 mg in sodium chloride 0.9 % 160 mL IVPB (has no administration in time range)  0.9 %  sodium chloride infusion (has no administration in time range)  diphenhydrAMINE (BENADRYL) injection 50 mg (has no administration in time range)  famotidine (PEPCID) IVPB 20 mg premix (has no administration in time range)  methylPREDNISolone sodium succinate (SOLU-MEDROL) 125 mg/2 mL injection 125 mg (has no administration in time range)  albuterol (VENTOLIN HFA) 108 (90 Base) MCG/ACT inhaler 2 puff (has no administration in time range)  EPINEPHrine (EPI-PEN) injection 0.3 mg (has no administration in time range)  sodium chloride 0.9 % bolus 1,000 mL (0 mLs Intravenous Stopped 10/07/20 2037)  magnesium sulfate IVPB 2 g 50 mL (2 g Intravenous New Bag/Given 10/07/20 2112)  sodium  chloride 0.9 % bolus 1,000 mL (1,000 mLs Intravenous New Bag/Given 10/07/20 2111)  cefTRIAXone (ROCEPHIN) 1 g in sodium chloride 0.9 % 100 mL IVPB (0 g Intravenous Stopped 10/07/20 2256)    ED Course  I have reviewed the triage vital signs and the nursing notes.  Pertinent labs & imaging results that were available during my care of the patient were reviewed by me and considered in my medical decision making (see chart for details).  This patient complains of near syncope.  This involves an extensive number of treatment options, and is a complaint that carries with it a high risk of complications and morbidity.  The differential diagnosis includes arrhythmia vs ACS vs dehydration vs medication induced hypotension vs anemia vs metabolic derangement vs other  No signs or symptoms of stroke - doubt TIA with this presentation  BP a little soft here for her baseline - normally hypertensive - will check ortho VS and give IVF.  I wonder if this may be hypotensive episode related to her new BP medications at home.  ECG on arrival per my view show NSR with no acute ischemia, no heart block.  COVID positive.  This may be contributing to her symptoms.  Unclear if this is acute.  We'll try to get her on airborne precautions as quickly as we can.  I ordered, reviewed, and interpreted labs, which included trop 3, Mg 1.6, UA with +Nitrties, leuks, bacteria, Na 131, Cl 87, Cr normal 0.9, hgb 12.8, WBC 5.2 I ordered medication IV magnesium, IV NaCl, IV rocephin for lab abnormalities, UTI I ordered imaging studies which included dg chest I independently visualized and interpreted imaging which showed low lung volumes, no active disease and the  monitor tracing which showed NSR Additional history was obtained from daughter at bedside  After the interventions stated above, I reevaluated the patient and found vitals and breathing stable on room air.  Okay for admission.  Lovey Gari was evaluated in Emergency  Department on 10/07/2020 for the symptoms described in the history of present illness. She was evaluated in the context of the global COVID-19 pandemic, which necessitated consideration that the patient might be at risk for infection with the SARS-CoV-2 virus that causes COVID-19. Institutional protocols and algorithms that pertain to the evaluation of patients at risk for COVID-19 are in a state of rapid change based on information released by regulatory bodies including the CDC and federal and state organizations. These policies and algorithms were followed during the patient's care in the ED.   Clinical Course as of 10/07/20 2256  Wed Oct 07, 2020  1612 IMPRESSION: Low lung volumes. No acute cardiopulmonary disease. [MT]  [id number] Covid positive [MT]  1844 Paged unassigned for admission for covid, hyponatremia, UTI [MT]  1945 Admitted to IM teaching service [MT]    Clinical Course User Index [MT] Bradely Rudin, Carola Rhine, MD    Final Clinical Impression(s) / ED Diagnoses Final diagnoses:  Syncope, unspecified syncope type  COVID-19  Hyponatremia  Cystitis    Rx / DC Orders ED Discharge Orders    None       Wyvonnia Dusky, MD 10/07/20 2257

## 2020-10-08 DIAGNOSIS — E871 Hypo-osmolality and hyponatremia: Principal | ICD-10-CM

## 2020-10-08 DIAGNOSIS — R55 Syncope and collapse: Secondary | ICD-10-CM | POA: Diagnosis present

## 2020-10-08 DIAGNOSIS — I10 Essential (primary) hypertension: Secondary | ICD-10-CM | POA: Diagnosis not present

## 2020-10-08 DIAGNOSIS — N3000 Acute cystitis without hematuria: Secondary | ICD-10-CM

## 2020-10-08 DIAGNOSIS — E119 Type 2 diabetes mellitus without complications: Secondary | ICD-10-CM

## 2020-10-08 DIAGNOSIS — U071 COVID-19: Secondary | ICD-10-CM | POA: Diagnosis not present

## 2020-10-08 DIAGNOSIS — M810 Age-related osteoporosis without current pathological fracture: Secondary | ICD-10-CM

## 2020-10-08 LAB — BASIC METABOLIC PANEL
Anion gap: 9 (ref 5–15)
Anion gap: 11 (ref 5–15)
Anion gap: 9 (ref 5–15)
Anion gap: 11 (ref 5–15)
Anion gap: 9 (ref 5–15)
BUN: 13 mg/dL (ref 8–23)
BUN: 14 mg/dL (ref 8–23)
BUN: 11 mg/dL (ref 8–23)
BUN: 11 mg/dL (ref 8–23)
BUN: 12 mg/dL (ref 8–23)
CO2: 20 mmol/L — ABNORMAL LOW (ref 22–32)
CO2: 19 mmol/L — ABNORMAL LOW (ref 22–32)
CO2: 20 mmol/L — ABNORMAL LOW (ref 22–32)
CO2: 18 mmol/L — ABNORMAL LOW (ref 22–32)
CO2: 22 mmol/L (ref 22–32)
Calcium: 8.6 mg/dL — ABNORMAL LOW (ref 8.9–10.3)
Calcium: 8.7 mg/dL — ABNORMAL LOW (ref 8.9–10.3)
Calcium: 8.7 mg/dL — ABNORMAL LOW (ref 8.9–10.3)
Calcium: 8.8 mg/dL — ABNORMAL LOW (ref 8.9–10.3)
Calcium: 9.3 mg/dL (ref 8.9–10.3)
Chloride: 99 mmol/L (ref 98–111)
Chloride: 100 mmol/L (ref 98–111)
Chloride: 101 mmol/L (ref 98–111)
Chloride: 101 mmol/L (ref 98–111)
Chloride: 98 mmol/L (ref 98–111)
Creatinine, Ser: 0.82 mg/dL (ref 0.44–1.00)
Creatinine, Ser: 0.75 mg/dL (ref 0.44–1.00)
Creatinine, Ser: 0.69 mg/dL (ref 0.44–1.00)
Creatinine, Ser: 0.67 mg/dL (ref 0.44–1.00)
Creatinine, Ser: 0.74 mg/dL (ref 0.44–1.00)
GFR, Estimated: >60 mL/min (ref >60)
GFR, Estimated: >60 mL/min (ref >60)
GFR, Estimated: >60 mL/min (ref >60)
GFR, Estimated: >60 mL/min (ref >60)
GFR, Estimated: >60 mL/min (ref >60)
Glucose, Bld: 139 mg/dL — ABNORMAL HIGH (ref 70–99)
Glucose, Bld: 128 mg/dL — ABNORMAL HIGH (ref 70–99)
Glucose, Bld: 115 mg/dL — ABNORMAL HIGH (ref 70–99)
Glucose, Bld: 126 mg/dL — ABNORMAL HIGH (ref 70–99)
Glucose, Bld: 128 mg/dL — ABNORMAL HIGH (ref 70–99)
Potassium: 4.1 mmol/L (ref 3.5–5.1)
Potassium: 4.1 mmol/L (ref 3.5–5.1)
Potassium: 4.1 mmol/L (ref 3.5–5.1)
Potassium: 3.8 mmol/L (ref 3.5–5.1)
Potassium: 3.8 mmol/L (ref 3.5–5.1)
Sodium: 128 mmol/L — ABNORMAL LOW (ref 135–145)
Sodium: 130 mmol/L — ABNORMAL LOW (ref 135–145)
Sodium: 130 mmol/L — ABNORMAL LOW (ref 135–145)
Sodium: 130 mmol/L — ABNORMAL LOW (ref 135–145)
Sodium: 129 mmol/L — ABNORMAL LOW (ref 135–145)

## 2020-10-08 LAB — OSMOLALITY: Osmolality: 280 mOsm/kg (ref 275–295)

## 2020-10-08 LAB — CBC
HCT: 35.4 % — ABNORMAL LOW (ref 36.0–46.0)
Hemoglobin: 11.7 g/dL — ABNORMAL LOW (ref 12.0–15.0)
MCH: 29 pg (ref 26.0–34.0)
MCHC: 33.1 g/dL (ref 30.0–36.0)
MCV: 87.8 fL (ref 80.0–100.0)
Platelets: 241 10*3/uL (ref 150–400)
RBC: 4.03 MIL/uL (ref 3.87–5.11)
RDW: 12.8 % (ref 11.5–15.5)
WBC: 4.8 10*3/uL (ref 4.0–10.5)
nRBC: 0 % (ref 0.0–0.2)

## 2020-10-08 LAB — GLUCOSE, CAPILLARY
Glucose-Capillary: 114 mg/dL — ABNORMAL HIGH (ref 70–99)
Glucose-Capillary: 119 mg/dL — ABNORMAL HIGH (ref 70–99)

## 2020-10-08 LAB — OSMOLALITY, URINE: Osmolality, Ur: 335 mOsm/kg (ref 300–900)

## 2020-10-08 LAB — TROPONIN I (HIGH SENSITIVITY): Troponin I (High Sensitivity): 4 ng/L (ref <18)

## 2020-10-08 LAB — BRAIN NATRIURETIC PEPTIDE: B Natriuretic Peptide: 32.8 pg/mL (ref 0.0–100.0)

## 2020-10-08 MED ORDER — DEXTROSE 5 % IV SOLN: INTRAVENOUS | Status: AC

## 2020-10-08 MED ORDER — MENTHOL 3 MG MT LOZG
1.0000 | LOZENGE | OROMUCOSAL | Status: DC | PRN
  Administered 2020-10-08: 07:00:00 3 mg via ORAL
  Filled 2020-10-08: qty 9

## 2020-10-08 MED ORDER — POLYETHYLENE GLYCOL 3350 17 G PO PACK: 17.0000 g | PACK | Freq: Every day | ORAL | Status: DC

## 2020-10-08 MED ORDER — SENNOSIDES-DOCUSATE SODIUM 8.6-50 MG PO TABS
1.0000 | ORAL_TABLET | Freq: Once | ORAL | Status: AC
  Administered 2020-10-08: 11:00:00 1 via ORAL

## 2020-10-08 MED ORDER — DEXTROSE 5 % IV SOLN: INTRAVENOUS | Status: DC

## 2020-10-08 MED ORDER — SODIUM CHLORIDE 0.9 % IV SOLN
1.0000 g | Freq: Every day | INTRAVENOUS | Status: DC
  Administered 2020-10-08: 14:00:00 1 g via INTRAVENOUS
  Filled 2020-10-08: qty 10

## 2020-10-08 MED ORDER — SULFAMETHOXAZOLE-TRIMETHOPRIM 800-160 MG PO TABS: 1.0000 | ORAL_TABLET | Freq: Two times a day (BID) | ORAL | 0 refills | Status: AC

## 2020-10-08 NOTE — Progress Notes (Signed)
Subjective:   Overnight, no acute events.  This morning, Amanda Schneider states she is doing well and denies any specific complaints. She denies shortness of breath or cough. She denies any abdominal pain but notes some constipation for the past several months. She endorses dysuria recently but states it has resolved. She would like her daughter Amanda Schneider to be updated. She is very thankful of the care that she has received since admission.  Objective: Vital signs in last 24 hours: Vitals:   10/08/20 0548 10/08/20 0743 10/08/20 1150 10/08/20 1233  BP: (!) 150/71 99/66 (!) 120/59   Pulse: 82 75 78 78  Resp: 18 16 18    Temp: 98.6 F (37 C) 97.7 F (36.5 C) 97.6 F (36.4 C)   TempSrc: Oral Oral Oral   SpO2: 94% 96% 96%   Weight: 55.8 kg     Height: 5' (1.524 m)     Room air [facility] Weights   10/07/20 1447 10/08/20 0548  Weight: 62.6 kg 55.8 kg    Intake/Output Summary (Last 24 hours) at 10/08/2020 1247 Last data filed at 10/08/2020 0935 Gross per 24 hour  Intake 1391.74 ml  Output --  Net 1391.74 ml  Physical Exam Constitutional:      General: She is not in acute distress.    Appearance: Normal appearance. She is not toxic-appearing.  Cardiovascular:     Rate and Rhythm: Normal rate and regular rhythm.     Pulses: Normal pulses.     Heart sounds: Normal heart sounds.  Pulmonary:     Effort: Pulmonary effort is normal. No respiratory distress.     Breath sounds: Normal breath sounds.  Abdominal:     General: Abdomen is flat. Bowel sounds are normal.     Palpations: Abdomen is soft.     Tenderness: There is no abdominal tenderness.  Musculoskeletal:        General: Normal range of motion.     Right lower leg: No edema.     Left lower leg: No edema.  Skin:    General: Skin is warm and dry.     Capillary Refill: Capillary refill takes less than 2 seconds.  Neurological:     General: No focal deficit present.     Mental Status: She is alert. Mental status is at baseline.   Psychiatric:        Mood and Affect: Mood normal.        Behavior: Behavior normal.        Thought Content: Thought content normal.        Judgment: Judgment normal.    CBC Latest Ref Rng & Units 10/08/2020 10/07/2020 03/02/2015  WBC 4.0 - 10.5 K/uL 4.8 5.2 -  Hemoglobin 12.0 - 15.0 g/dL 11.7(L) 12.8 13.0  Hematocrit 36.0 - 46.0 % 35.4(L) 36.6 -  Platelets 150 - 400 K/uL 241 265 -    BMP Latest Ref Rng & Units 10/08/2020 10/08/2020 10/08/2020  Glucose 70 - 99 mg/dL 128(H) 126(H) 115(H)  BUN 8 - 23 mg/dL 12 11 11   Creatinine 0.44 - 1.00 mg/dL 0.74 0.67 0.69  Sodium 135 - 145 mmol/L 129(L) 130(L) 130(L)  Potassium 3.5 - 5.1 mmol/L 3.8 3.8 4.1  Chloride 98 - 111 mmol/L 98 101 101  CO2 22 - 32 mmol/L 22 18(L) 20(L)  Calcium 8.9 - 10.3 mg/dL 9.3 8.8(L) 8.7(L)   Serum osmolality - 280 Urine osmolality - 335 Urine culture - needs to be collected Glucose - 119, 114  IMAGING (24h):  DG Chest 2 View IMPRESSION: Low lung volumes. No acute cardiopulmonary disease.  Assessment/Plan:  Active Problems:   Hyponatremia  Amanda Schneider is a 85 year old female with past medical history significant for HTN, stage II ductal carcinoma s/p lumpectomy and radiation and anastrazole, DM, osteoporosis who presented to [facility] 12/22 following a syncopal episode found to have hyponatremia in the setting of recently starting HCTZ and sodium restricted diet, urinary tract infection and COVID.  #Syncope, resolved Patient has not had recurrence of syncope since admission. Patient denies lightheadedness or dizziness today. Orthostatic vital signs are negative. Labs show improving hyponatremia. Patient now on antibiotics for urinary tract infection and s/p monoclonal antibody for COVID-19 infection. -Management of UTI, COVID and hyponatremia per below  #Hyponatremia, improving Patient had episode of syncope in 2016 following starting HCTZ found to be hyponatremic. Presentation yesterday similar to this  episode. Patient is slightly above goal of 125-127, currently 129 following 1.5L normal saline bolus in ED followed by D5W for overcorrection. -Discontinue IVF -Repeat BMP  #Acute simple cystitis, active, stable Patient continues to endorse burning sensation with urination. She was started on ceftriaxone in the ED. No fevers or leukocytosis.  -Continue ceftriaxone while admitted -Transition to PO antibiotics upon discharge  #COVID-19 infection, active, stable Patient denies shortness or breath today. She is saturating near 100% on room air. She tolerated monoclonal antibody infusion well without complaints. -Continue to monitor -Isolate for total of 10 days following discharge  #HTN, chronic Patient blood pressure stable today on losartan only. -Continue home losartan 50mg  daily -Will need close follow-up with PCP for consideration of alternative medications -Discontinue HCTZ  #T2DM, chronic A1c of 6.1. Home meds of metformin and linagliptin. Patient has not required insulin while admitted. -Continue SSI (0-9 units)  #Osteoporosis, chronic Home meds of alendronate and Ca-vit D. -Continue Ca-VitD 500-200mg  daily  #VTE ppx: Lovenox #Bowel regimen: Daily miralax #Code status: DNR #IVF: None #PT/OT recs: Pending  Cato Mulligan, MD 10/08/2020, 12:47 PM Pager: [phone] After 5pm on weekdays and 1pm on weekends: On Call pager [phone]

## 2020-10-08 NOTE — Discharge Instructions (Signed)
Ms. Kunzler,   It was a pleasure meeting you today. You were hospitalized for low sodium in your blood, COVID-19 infection and a urinary tract infection. After receiving IV fluids, your sodium level began to rise appropriately. You received a monoclonal antibody infusion for your COVID-19 infection, however you are doing very well with no shortness of breath, cough or needing oxygen supplementation. For your urinary tract infection, you received IV antibiotics while admitted, and we will discharge you with two additional days of an antibiotic called trimethoprim-sulfamethoxazole, or bactrim. Please follow-up closely with your primary care physician at the start of the new year for repeat labs and hospital follow-up.  If you develop new or worsening symptoms, please come back to the emergency department for evaluation.  Sincerely, Dr. Paulla Dolly, MD   DASH Eating Plan DASH stands for "Dietary Approaches to Stop Hypertension." The DASH eating plan is a healthy eating plan that has been shown to reduce high blood pressure (hypertension). It may also reduce your risk for type 2 diabetes, heart disease, and stroke. The DASH eating plan may also help with weight loss. What are tips for following this plan?  General guidelines  Avoid eating more than 2,300 mg (milligrams) of salt (sodium) a day. If you have hypertension, you may need to reduce your sodium intake to 1,500 mg a day.  Limit alcohol intake to no more than 1 drink a day for nonpregnant women and 2 drinks a day for men. One drink equals 12 oz of beer, 5 oz of wine, or 1 oz of hard liquor.  Work with your health care provider to maintain a healthy body weight or to lose weight. Ask what an ideal weight is for you.  Get at least 30 minutes of exercise that causes your heart to beat faster (aerobic exercise) most days of the week. Activities may include walking, swimming, or biking.  Work with your health care provider or diet and  nutrition specialist (dietitian) to adjust your eating plan to your individual calorie needs. Reading food labels   Check food labels for the amount of sodium per serving. Choose foods with less than 5 percent of the Daily Value of sodium. Generally, foods with less than 300 mg of sodium per serving fit into this eating plan.  To find whole grains, look for the word "whole" as the first word in the ingredient list. Shopping  Buy products labeled as "low-sodium" or "no salt added."  Buy fresh foods. Avoid canned foods and premade or frozen meals. Cooking  Avoid adding salt when cooking. Use salt-free seasonings or herbs instead of table salt or sea salt. Check with your health care provider or pharmacist before using salt substitutes.  Do not fry foods. Cook foods using healthy methods such as baking, boiling, grilling, and broiling instead.  Cook with heart-healthy oils, such as olive, canola, soybean, or sunflower oil. Meal planning  Eat a balanced diet that includes: ? 5 or more servings of fruits and vegetables each day. At each meal, try to fill half of your plate with fruits and vegetables. ? Up to 6-8 servings of whole grains each day. ? Less than 6 oz of lean meat, poultry, or fish each day. A 3-oz serving of meat is about the same size as a deck of cards. One egg equals 1 oz. ? 2 servings of low-fat dairy each day. ? A serving of nuts, seeds, or beans 5 times each week. ? Heart-healthy fats. Healthy fats called Omega-3  fatty acids are found in foods such as flaxseeds and coldwater fish, like sardines, salmon, and mackerel.  Limit how much you eat of the following: ? Canned or prepackaged foods. ? Food that is high in trans fat, such as fried foods. ? Food that is high in saturated fat, such as fatty meat. ? Sweets, desserts, sugary drinks, and other foods with added sugar. ? Full-fat dairy products.  Do not salt foods before eating.  Try to eat at least 2 vegetarian  meals each week.  Eat more home-cooked food and less restaurant, buffet, and fast food.  When eating at a restaurant, ask that your food be prepared with less salt or no salt, if possible. What foods are recommended? The items listed may not be a complete list. Talk with your dietitian about what dietary choices are best for you. Grains Whole-grain or whole-wheat bread. Whole-grain or whole-wheat pasta. Brown rice. [place]. Bulgur. Whole-grain and low-sodium cereals. Pita bread. Low-fat, low-sodium crackers. Whole-wheat flour tortillas. Vegetables Fresh or frozen vegetables (raw, steamed, roasted, or grilled). Low-sodium or reduced-sodium tomato and vegetable juice. Low-sodium or reduced-sodium tomato sauce and tomato paste. Low-sodium or reduced-sodium canned vegetables. Fruits All fresh, dried, or frozen fruit. Canned fruit in natural juice (without added sugar). Meat and other protein foods Skinless chicken or [place]. Ground chicken or [place]. Pork with fat trimmed off. Fish and seafood. Egg whites. Dried beans, peas, or lentils. Unsalted nuts, nut butters, and seeds. Unsalted canned beans. Lean cuts of beef with fat trimmed off. Low-sodium, lean deli meat. Dairy Low-fat (1%) or fat-free (skim) milk. Fat-free, low-fat, or reduced-fat cheeses. Nonfat, low-sodium ricotta or cottage cheese. Low-fat or nonfat yogurt. Low-fat, low-sodium cheese. Fats and oils Soft margarine without trans fats. Vegetable oil. Low-fat, reduced-fat, or light mayonnaise and salad dressings (reduced-sodium). Canola, safflower, olive, soybean, and sunflower oils. Avocado. Seasoning and other foods Herbs. Spices. Seasoning mixes without salt. Unsalted popcorn and pretzels. Fat-free sweets. What foods are not recommended? The items listed may not be a complete list. Talk with your dietitian about what dietary choices are best for you. Grains Baked goods made with fat, such as croissants, muffins, or some  breads. Dry pasta or rice meal packs. Vegetables Creamed or fried vegetables. Vegetables in a cheese sauce. Regular canned vegetables (not low-sodium or reduced-sodium). Regular canned tomato sauce and paste (not low-sodium or reduced-sodium). Regular tomato and vegetable juice (not low-sodium or reduced-sodium). Angie Fava. Olives. Fruits Canned fruit in a light or heavy syrup. Fried fruit. Fruit in cream or butter sauce. Meat and other protein foods Fatty cuts of meat. Ribs. Fried meat. Berniece Salines. Sausage. Bologna and other processed lunch meats. Salami. Fatback. Hotdogs. Bratwurst. Salted nuts and seeds. Canned beans with added salt. Canned or smoked fish. Whole eggs or egg yolks. Chicken or [place] with skin. Dairy Whole or 2% milk, cream, and half-and-half. Whole or full-fat cream cheese. Whole-fat or sweetened yogurt. Full-fat cheese. Nondairy creamers. Whipped toppings. Processed cheese and cheese spreads. Fats and oils Butter. Stick margarine. Lard. Shortening. Ghee. Bacon fat. Tropical oils, such as coconut, palm kernel, or palm oil. Seasoning and other foods Salted popcorn and pretzels. Onion salt, garlic salt, seasoned salt, table salt, and sea salt. Worcestershire sauce. Tartar sauce. Barbecue sauce. Teriyaki sauce. Soy sauce, including reduced-sodium. Steak sauce. Canned and packaged gravies. Fish sauce. Oyster sauce. Cocktail sauce. Horseradish that you find on the shelf. Ketchup. Mustard. Meat flavorings and tenderizers. Bouillon cubes. Hot sauce and Tabasco sauce. Premade or packaged marinades. Premade or  packaged taco seasonings. Relishes. Regular salad dressings. Where to find more information:  National Heart, Lung, and [place]: https://wilson-eaton.com/  American Heart Association: www.heart.org Summary  The DASH eating plan is a healthy eating plan that has been shown to reduce high blood pressure (hypertension). It may also reduce your risk for type 2 diabetes, heart disease, and  stroke.  With the DASH eating plan, you should limit salt (sodium) intake to 2,300 mg a day. If you have hypertension, you may need to reduce your sodium intake to 1,500 mg a day.  When on the DASH eating plan, aim to eat more fresh fruits and vegetables, whole grains, lean proteins, low-fat dairy, and heart-healthy fats.  Work with your health care provider or diet and nutrition specialist (dietitian) to adjust your eating plan to your individual calorie needs. This information is not intended to replace advice given to you by your health care provider. Make sure you discuss any questions you have with your health care provider. Document Revised: 09/15/2017 Document Reviewed: 09/26/2016 Elsevier Patient Education  2020 [employer].

## 2020-10-08 NOTE — Evaluation (Signed)
Physical Therapy Evaluation/ Discharge Patient Details Name: Amanda Schneider MRN: [id number] DOB: 07-05-1935 Today's Date: 10/08/2020   History of Present Illness  85 yo admitted to ED 12/22 with syncope at home with possible UTI and Covid 19 positive. PMhx: HTn, ductal carcinoma s/p lumpectomy, DM, osteoporosis  Clinical Impression  Pt very pleasant and moving well. Pt lives at home with daughter and grandchildren and cares for herself. Pt with positive orthostatics with initial standing but asymptomatic. Pt made aware of BP drop and need to stand and allow BP to stabilize currently before moving. Pt also encouraged to continue daily mobility and gait to maximize function acutely. Pt able to perform pericare and donning socks EOB without assist. Pt at baseline functional mobility without further need for therapy at this time with pt aware and agreeable, will sign off.   Orthostatic BPs  Supine 144/70, HR 79  Sitting 164/88, HR 89     Standing 117/81, HR 97  Standing after 3 min 137/86, HR 102       Follow Up Recommendations No PT follow up    Equipment Recommendations  None recommended by PT    Recommendations for Other Services       Precautions / Restrictions Precautions Precautions: Fall Precaution Comments: check BP orthostatic in standing      Mobility  Bed Mobility Overal bed mobility: Needs Assistance Bed Mobility: Supine to Sit     Supine to sit: Min guard     General bed mobility comments: HHA    Transfers Overall transfer level: Needs assistance   Transfers: Sit to/from Stand Sit to Stand: Supervision         General transfer comment: supervision for line, initial HHA with standing to steady with drop in BP with initial standing  Ambulation/Gait Ambulation/Gait assistance: Supervision Gait Distance (Feet): 200 Feet Assistive device: None Gait Pattern/deviations: Step-through pattern;Decreased stride length   Gait velocity interpretation: >2.62  ft/sec, indicative of community ambulatory General Gait Details: steady gait with decreased speed, supervision for direction and lines  Stairs            Wheelchair Mobility    Modified Rankin (Stroke Patients Only)       Balance Overall balance assessment: Mild deficits observed, not formally tested                                           Pertinent Vitals/Pain Pain Assessment: No/denies pain    Home Living Family/patient expects to be discharged to:: Private residence Living Arrangements: Children Available Help at Discharge: Family Type of Home: House Home Access: Stairs to enter   Secretary/administrator of Steps: 2 at entrance and 2 small to kitchen Home Layout: One level Home Equipment: Environmental consultant - 2 wheels      Prior Function Level of Independence: Independent         Comments: uses RW on occasion. family does the homemaking     Hand Dominance        Extremity/Trunk Assessment   Upper Extremity Assessment Upper Extremity Assessment: Generalized weakness    Lower Extremity Assessment Lower Extremity Assessment: Generalized weakness    Cervical / Trunk Assessment Cervical / Trunk Assessment: Kyphotic  Communication   Communication: Interpreter utilized;Prefers language other than English (lazaro [phone])  Cognition Arousal/Alertness: Awake/alert Behavior During Therapy: WFL for tasks assessed/performed Overall Cognitive Status: Within Functional Limits for tasks assessed  General Comments      Exercises     Assessment/Plan    PT Assessment Patent does not need any further PT services  PT Problem List         PT Treatment Interventions      PT Goals (Current goals can be found in the Care Plan section)  Acute Rehab PT Goals PT Goal Formulation: All assessment and education complete, DC therapy    Frequency     Barriers to discharge         Co-evaluation               AM-PAC PT "6 Clicks" Mobility  Outcome Measure Help needed turning from your back to your side while in a flat bed without using bedrails?: None Help needed moving from lying on your back to sitting on the side of a flat bed without using bedrails?: None Help needed moving to and from a bed to a chair (including a wheelchair)?: A Little Help needed standing up from a chair using your arms (e.g., wheelchair or bedside chair)?: A Little Help needed to walk in hospital room?: A Little Help needed climbing 3-5 steps with a railing? : None 6 Click Score: 21    End of Session   Activity Tolerance: Patient tolerated treatment well Patient left: in chair;with call bell/phone within reach;with chair alarm set Nurse Communication: Mobility status PT Visit Diagnosis: Other abnormalities of gait and mobility (R26.89)    Time: 4010-2725 PT Time Calculation (min) (ACUTE ONLY): 34 min   Charges:   PT Evaluation $PT Eval Moderate Complexity: 1 Mod PT Treatments $Therapeutic Activity: 8-22 mins        Relda Agosto P, PT Acute Rehabilitation Services Pager: [phone] Office: [phone]   Sherissa Tenenbaum B Marquavious Nazar 10/08/2020, 1:11 PM

## 2020-10-08 NOTE — ED Notes (Signed)
Spoke with provider Rehman regarding sodium 126 result, this RN to collect BMP at 0130.

## 2020-10-08 NOTE — ED Notes (Signed)
Paged attending for RN 

## 2020-10-08 NOTE — ED Notes (Signed)
Bedside commode placed inside pts room.

## 2020-10-08 NOTE — Discharge Summary (Signed)
Name: Amanda Schneider MRN: [id number] DOB: December 23, 1934 85 y.o. PCP: Patient, No Pcp Per  Date of Admission: 10/07/2020  2:38 PM Date of Discharge: 10/08/2020 Attending Physician: Aldine Contes, MD  Discharge Diagnosis: 1. Syncope 2. Hyponatremia 3. COVID-19 infection 4. Acute simple cystitis 5. Orthostatic hypotension  Discharge Medications: Allergies as of 10/08/2020      Reactions   Hydrochlorothiazide Other (See Comments)   hyponatremia      Medication List    STOP taking these medications   alendronate 70 MG tablet Commonly known as: FOSAMAX   anastrozole 1 MG tablet Commonly known as: ARIMIDEX   HYDROcodone-acetaminophen 5-325 MG tablet Commonly known as: Norco     TAKE these medications   acetaminophen 325 MG tablet Commonly known as: TYLENOL Take 325-650 mg by mouth every 6 (six) hours as needed for mild pain or headache.   Antacid 750 MG chewable tablet Generic drug: calcium carbonate Chew 1 tablet by mouth as needed for heartburn (or acid reflux).   CALCIUM + D3 PO Take 1 tablet by mouth daily with breakfast.   linagliptin 5 MG Tabs tablet Commonly known as: TRADJENTA Take 5 mg by mouth daily.   losartan 100 MG tablet Commonly known as: COZAAR Take 100 mg by mouth daily. What changed: Another medication with the same name was removed. Continue taking this medication, and follow the directions you see here.   metFORMIN 1000 MG tablet Commonly known as: GLUCOPHAGE Take 1,000 mg by mouth 2 (two) times daily with a meal.   non-metallic deodorant Misc Commonly known as: ALRA Apply 1 application topically daily.   raloxifene 60 MG tablet Commonly known as: EVISTA Take 60 mg by mouth daily.   sulfamethoxazole-trimethoprim 800-160 MG tablet Commonly known as: BACTRIM DS Take 1 tablet by mouth 2 (two) times daily for 2 days. Start taking on: October 09, 2020       Disposition and follow-up:   Ms.Amanda Schneider was discharged from [facility] in Stable condition.  At the hospital follow up visit please address:  1.   Syncope/hyponatremia: Patient had syncopal episode at home prior to arrival. Patient found to have significant hyponatremia (121) in the setting of HCTZ use. Patient had similar episode back in 2016 following starting HCTZ. Medication has been discontinued. Sodium of 129 on day of discharge. Please obtain repeat BMP at follow-up visit and consider alternative therapy to treat patient's hypertension.   COVID-19 infection: Patient tested positive for COVID-19 on 12/22, however she denies cough or shortness of breath and has no new oxygen requirement. Received monoclonal antibody infusion. Patient to isolate for nine additional days. Please assess patient's recovery from infection.  Urinary tract infection: Patient reported burning with urination found to have urinalysis consistent with acute simple cystitis. Patient received ceftriaxone while admitted and discharge with plan to complete course of oral antibiotics. Assess patient's adherence to this medication regimen and recovery.  Positive orthostatic hypotension: On day of discharge, patient noted to have positive orthostatics while working with physical therapy despite negative orthostatics on admission. Please evaluate this condition in clinic.  2.  Labs / imaging needed at time of follow-up: BMP  3.  Pending labs/ test needing follow-up: None  Follow-up Appointments:  Follow-up Information    Cathleen Corti, PA-C. Schedule an appointment as soon as possible for a visit in 2 week(s).   Specialty: Physician Radio producer information: [address] [place] [address] [phone]  Hospital Course by problem list: 1. Syncope/hyponatremia - Patient presented following a syncopal episode at home. On arrival to ED, patient was found to have significant hyponatremia, urinary tract infection and COVID-19 infection.  Patient's syncope likely multifactorial in the setting of electrolyte derangements and active infections. Patient's HCTZ was discontinued as this medication was thought to be a factor in her hyponatremia. Additionally, patient had a similar presentation in 2016 following being prescribed HCTZ where she syncopized and had hyponatremia. Patient received 1.5L normal saline with correction of sodium to 130. Patient received small bolus of D5W with sodium stable at 129. Patient did not have continuation of dizziness, lightheadedness or repeat syncopal episodes during admission.  2. COVID-19 infection - patient tested positive for COVID-19 infection on admission. Patient did not have cough, shortness of breath or new oxygen requirement. She received monoclonal antibody infusion.  3. Urinary tract infection - Patient complained of burning with urination and urinalysis revealed urinary tract infection. Patient started on ceftriaxone and received dose on 12/22 and 12/23. Patient to be discharged with two day course of high-dose trimethoprim-sulfamethoxazole twice daily. She did not have fevers, chills, leukocytosis throughout admission.  4. Orthostatic hypotension - Patient had negative orthostatics on admission, however patient had positive orthostatics while working with physical therapy. She was asymptomatic at this time.  Discharge Vitals:   BP (!) 120/59 (BP Location: Left Arm)   Pulse 78   Temp 97.6 F (36.4 C) (Oral)   Resp 18   Ht 5' (1.524 m)   Wt 55.8 kg   SpO2 96%   BMI 24.03 kg/m   Pertinent Labs, Studies, and Procedures:  CBC Latest Ref Rng & Units 10/08/2020 10/07/2020 03/02/2015  WBC 4.0 - 10.5 K/uL 4.8 5.2 -  Hemoglobin 12.0 - 15.0 g/dL 11.7(L) 12.8 13.0  Hematocrit 36.0 - 46.0 % 35.4(L) 36.6 -  Platelets 150 - 400 K/uL 241 265 -   CMP Latest Ref Rng & Units 10/08/2020 10/08/2020 10/08/2020  Glucose 70 - 99 mg/dL 128(H) 126(H) 115(H)  BUN 8 - 23 mg/dL 12 11 11   Creatinine 0.44 -  1.00 mg/dL 0.74 0.67 0.69  Sodium 135 - 145 mmol/L 129(L) 130(L) 130(L)  Potassium 3.5 - 5.1 mmol/L 3.8 3.8 4.1  Chloride 98 - 111 mmol/L 98 101 101  CO2 22 - 32 mmol/L 22 18(L) 20(L)  Calcium 8.9 - 10.3 mg/dL 9.3 8.8(L) 8.7(L)   DG Chest 2 View  Result Date: 10/07/2020 CLINICAL DATA:  Near syncope EXAM: CHEST - 2 VIEW COMPARISON:  12/15/2014 FINDINGS: The heart size and mediastinal contours are within normal limits. Low lung volumes. No focal airspace consolidation, pleural effusion, or pneumothorax. The visualized skeletal structures are unremarkable. IMPRESSION: Low lung volumes. No acute cardiopulmonary disease. Electronically Signed   By: Davina Poke D.O.   On: 10/07/2020 15:40   Discharge Instructions: Discharge Instructions    Call MD for:  difficulty breathing, headache or visual disturbances   Complete by: As directed    Call MD for:  extreme fatigue   Complete by: As directed    Call MD for:  hives   Complete by: As directed    Call MD for:  persistant dizziness or light-headedness   Complete by: As directed    Call MD for:  persistant nausea and vomiting   Complete by: As directed    Call MD for:  redness, tenderness, or signs of infection (pain, swelling, redness, odor or green/yellow discharge around incision site)   Complete by: As directed  Call MD for:  severe uncontrolled pain   Complete by: As directed    Call MD for:  temperature >100.4   Complete by: As directed    Diet - low sodium heart healthy   Complete by: As directed    Diet Carb Modified   Complete by: As directed    Increase activity slowly   Complete by: As directed      Signed: Cato Mulligan, MD 10/08/2020, 2:36 PM   Pager: [phone]

## 2020-10-08 NOTE — Progress Notes (Signed)
Patient received to the unit. Patient is alert and oriented x4. Iv in place and running fluid. Vital signs are stable. Skin assessment done with another nurse. No skin breakdown noted on examinations. Patient speaks Spanish need Interpreter. Given instructions about call bell and phone. Bed in low position and call bell in reach.

## 2020-10-08 NOTE — Plan of Care (Signed)
Doctor states he spoke with daughter for d/c instructions.  Patient is ready to go home.

## 2020-10-13 ENCOUNTER — Emergency Department (HOSPITAL_COMMUNITY)
Admission: EM | Admit: 2020-10-13 | Discharge: 2020-10-13 | Disposition: A | Discharge status: Home / Self Care | Payer: Medicare Other | Attending: Emergency Medicine | Admitting: Emergency Medicine

## 2020-10-13 DIAGNOSIS — I10 Essential (primary) hypertension: Secondary | ICD-10-CM | POA: Diagnosis not present

## 2020-10-13 DIAGNOSIS — R059 Cough, unspecified: Secondary | ICD-10-CM | POA: Insufficient documentation

## 2020-10-13 DIAGNOSIS — R110 Nausea: Secondary | ICD-10-CM | POA: Diagnosis not present

## 2020-10-13 DIAGNOSIS — Z853 Personal history of malignant neoplasm of breast: Secondary | ICD-10-CM | POA: Insufficient documentation

## 2020-10-13 DIAGNOSIS — R42 Dizziness and giddiness: Secondary | ICD-10-CM | POA: Diagnosis not present

## 2020-10-13 DIAGNOSIS — Z7984 Long term (current) use of oral hypoglycemic drugs: Secondary | ICD-10-CM | POA: Diagnosis not present

## 2020-10-13 DIAGNOSIS — R55 Syncope and collapse: Secondary | ICD-10-CM | POA: Diagnosis not present

## 2020-10-13 DIAGNOSIS — Z8616 Personal history of COVID-19: Secondary | ICD-10-CM | POA: Diagnosis not present

## 2020-10-13 DIAGNOSIS — E119 Type 2 diabetes mellitus without complications: Secondary | ICD-10-CM | POA: Diagnosis not present

## 2020-10-13 DIAGNOSIS — R531 Weakness: Secondary | ICD-10-CM | POA: Insufficient documentation

## 2020-10-13 LAB — COMPREHENSIVE METABOLIC PANEL
ALT: 11 U/L (ref 0–44)
AST: 18 U/L (ref 15–41)
Albumin: 3.6 g/dL (ref 3.5–5.0)
Alkaline Phosphatase: 41 U/L (ref 38–126)
Anion gap: 9 (ref 5–15)
BUN: 11 mg/dL (ref 8–23)
CO2: 18 mmol/L — ABNORMAL LOW (ref 22–32)
Calcium: 9.3 mg/dL (ref 8.9–10.3)
Chloride: 105 mmol/L (ref 98–111)
Creatinine, Ser: 0.73 mg/dL (ref 0.44–1.00)
GFR, Estimated: >60 mL/min (ref >60)
Glucose, Bld: 116 mg/dL — ABNORMAL HIGH (ref 70–99)
Potassium: 4.4 mmol/L (ref 3.5–5.1)
Sodium: 132 mmol/L — ABNORMAL LOW (ref 135–145)
Total Bilirubin: 0.3 mg/dL (ref 0.3–1.2)
Total Protein: 6.5 g/dL (ref 6.5–8.1)

## 2020-10-13 LAB — CBC WITH DIFFERENTIAL/PLATELET
Abs Immature Granulocytes: 0.03 10*3/uL (ref 0.00–0.07)
Basophils Absolute: 0 10*3/uL (ref 0.0–0.1)
Basophils Relative: 0 %
Eosinophils Absolute: 0.1 10*3/uL (ref 0.0–0.5)
Eosinophils Relative: 1 %
HCT: 34.1 % — ABNORMAL LOW (ref 36.0–46.0)
Hemoglobin: 11.5 g/dL — ABNORMAL LOW (ref 12.0–15.0)
Immature Granulocytes: 1 %
Lymphocytes Relative: 23 %
Lymphs Abs: 1.2 10*3/uL (ref 0.7–4.0)
MCH: 29.7 pg (ref 26.0–34.0)
MCHC: 33.7 g/dL (ref 30.0–36.0)
MCV: 88.1 fL (ref 80.0–100.0)
Monocytes Absolute: 0.3 10*3/uL (ref 0.1–1.0)
Monocytes Relative: 6 %
Neutro Abs: 3.8 10*3/uL (ref 1.7–7.7)
Neutrophils Relative %: 69 %
Platelets: 237 10*3/uL (ref 150–400)
RBC: 3.87 MIL/uL (ref 3.87–5.11)
RDW: 13 % (ref 11.5–15.5)
WBC: 5.5 10*3/uL (ref 4.0–10.5)
nRBC: 0 % (ref 0.0–0.2)

## 2020-10-13 LAB — URINALYSIS, ROUTINE W REFLEX MICROSCOPIC
Bilirubin Urine: NEGATIVE
Glucose, UA: NEGATIVE mg/dL
Hgb urine dipstick: NEGATIVE
Ketones, ur: NEGATIVE mg/dL
Leukocytes,Ua: NEGATIVE
Nitrite: NEGATIVE
Protein, ur: NEGATIVE mg/dL
Specific Gravity, Urine: 1.009 (ref 1.005–1.030)
pH: 6 (ref 5.0–8.0)

## 2020-10-13 LAB — LIPASE, BLOOD: Lipase: 25 U/L (ref 11–51)

## 2020-10-13 MED ORDER — SODIUM CHLORIDE 0.9 % IV BOLUS
500.0000 mL | Freq: Once | INTRAVENOUS | Status: AC
  Administered 2020-10-13: 17:00:00 500 mL via INTRAVENOUS

## 2020-10-13 NOTE — ED Notes (Signed)
Pt ambulated in room multiple times when walking to the bedside commode. Per ED MD pt does not need to ambulate in room anymore. Pt can not ambulate in hall due to positive COVID-19.

## 2020-10-13 NOTE — ED Provider Notes (Signed)
[facility] EMERGENCY DEPARTMENT Provider Note   CSN: [id number] Arrival date & time: 10/13/20  1519     History No chief complaint on file.   Amanda Schneider is a 85 y.o. female.  Patient is a 85 year old female who presents after near syncopal episode.  She had a recent admission from December 22 to December 23.  She had had a syncopal event at that time and was found to be hyponatremic with a sodium of 121.  She was also diagnosed with COVID 19.  She was asymptomatic and received a monoclonal antibody treatment in the hospital.  She also had a UTI which was treated with ceftriaxone.  She says she has been doing well at home.  She says she was seen outside today and had some cramping in her abdomen.  She went to the bathroom and had a loose stool.  During this time she became nauseated and dizzy.  She felt very weak and felt like she might pass out.  When EMS got there she was slightly lethargic per the report.  She was given 500 cc of normal saline.  She denies any chest pain or palpitations.  She has a mild cough but no shortness of breath.  No recent fevers.  She currently denies any abdominal pain.  She says that her stomach is feeling better.  She feels a little bit weak but better than she did before.  She denies any current nausea.  She has not had any recent vomiting or diarrhea.  She did not fall during this episode.  History was obtained through video language line.        Past Medical History:  Diagnosis Date  . Anxiety   . Arthritis    "knees, arms" (12/15/2014)  . Cancer (HCC)   . Depression   . GERD (gastroesophageal reflux disease)   . Hypertension   . Left breast mass    "just noticed it ~ 3 wks ago" (12/15/2014)  . Osteoporosis   . Radiation 04/01/15-04/23/15   Left Breast 21 fractions/42.72 Gy  . Seizures (HCC) 12/14/2014   "just the one on 12/14/2014) (12/15/2014) due to low sodium  . Type II diabetes mellitus [facility])     Patient Active Problem List    Diagnosis Date Noted  . Syncope 10/08/2020  . Osteoporosis 04/22/2015  . Breast cancer, left breast (HCC) 01/20/2015  . Syncope and collapse 12/15/2014  . Essential hypertension 12/15/2014  . Dehydration 12/15/2014  . Hyponatremia 12/15/2014  . DM II (diabetes mellitus, type II), controlled (HCC) 12/15/2014  . Left breast lump 12/15/2014    Past Surgical History:  Procedure Laterality Date  . BREAST LUMPECTOMY WITH SENTINEL LYMPH NODE BIOPSY Left 02/03/2015   Procedure: LEFT BREAST LUMPECTOMY WITH SENTINEL LYMPH NODE BX;  Surgeon: Abigail Miyamoto, MD;  Location: [facility];  Service: General;  Laterality: Left;  . CERVICAL BIOPSY    . RE-EXCISION OF BREAST LUMPECTOMY Left 03/02/2015   Procedure: RE-EXCISION OF LEFT BREAST CANCER;  Surgeon: Abigail Miyamoto, MD;  Location: [facility];  Service: General;  Laterality: Left;     OB History   No obstetric history on file.     Family History  Problem Relation Age of Onset  . Cancer Mother        unknown type  . Seizures Grandson   . Heart attack Neg Hx     Social History   Tobacco Use  . Smoking status: Never Smoker  . Smokeless  tobacco: Never Used  Substance Use Topics  . Alcohol use: No    Alcohol/week: 0.0 standard drinks  . Drug use: No    Home Medications Prior to Admission medications   Medication Sig Start Date End Date Taking? Authorizing Provider  acetaminophen (TYLENOL) 325 MG tablet Take 325-650 mg by mouth every 6 (six) hours as needed for mild pain or headache.   Yes [provider]  calcium carbonate (ANTACID) 750 MG chewable tablet Chew 1 tablet by mouth as needed for heartburn (or acid reflux).   Yes [provider]  cyclobenzaprine (FLEXERIL) 5 MG tablet Take 5 mg by mouth daily as needed for muscle spasms.   Yes [provider]  linagliptin (TRADJENTA) 5 MG TABS tablet Take 5 mg by mouth daily.   Yes [provider]  losartan (COZAAR)  100 MG tablet Take 100 mg by mouth daily.   Yes [provider]  metFORMIN (GLUCOPHAGE) 1000 MG tablet Take 1,000 mg by mouth 2 (two) times daily with a meal.   Yes [provider]  pantoprazole (PROTONIX) 40 MG tablet Take 40 mg by mouth daily as needed (for heartburn).   Yes [provider]  raloxifene (EVISTA) 60 MG tablet Take 60 mg by mouth daily.   Yes [provider]  Calcium Carb-Cholecalciferol (CALCIUM + D3 PO) Take 1 tablet by mouth daily with breakfast.    [provider]  non-metallic deodorant Jethro Poling) MISC Apply 1 application topically daily.    [provider]    Allergies    Hydrochlorothiazide  Review of Systems   Review of Systems  Constitutional: Positive for fatigue. Negative for chills, diaphoresis and fever.  HENT: Negative for congestion, rhinorrhea and sneezing.   Eyes: Negative.   Respiratory: Positive for cough. Negative for chest tightness and shortness of breath.   Cardiovascular: Negative for chest pain and leg swelling.  Gastrointestinal: Positive for abdominal pain, diarrhea (1 loose stool) and nausea. Negative for blood in stool and vomiting.  Genitourinary: Negative for difficulty urinating, flank pain, frequency and hematuria.  Musculoskeletal: Negative for arthralgias and back pain.  Skin: Negative for rash.  Neurological: Positive for syncope (Near syncope) and light-headedness. Negative for dizziness, speech difficulty, weakness, numbness and headaches.    Physical Exam Updated Vital Signs BP 139/64   Pulse 82   Temp 98.2 F (36.8 C) (Oral)   Resp 15   SpO2 98%   Physical Exam Constitutional:      Appearance: She is well-developed and well-nourished.  HENT:     Head: Normocephalic and atraumatic.  Eyes:     Pupils: Pupils are equal, round, and reactive to light.  Cardiovascular:     Rate and Rhythm: Normal rate and regular rhythm.     Heart sounds: Normal heart sounds.  Pulmonary:      Effort: Pulmonary effort is normal. No respiratory distress.     Breath sounds: Normal breath sounds. No wheezing or rales.  Chest:     Chest wall: No tenderness.  Abdominal:     General: Bowel sounds are normal.     Palpations: Abdomen is soft.     Tenderness: There is no abdominal tenderness. There is no guarding or rebound.  Musculoskeletal:        General: No edema. Normal range of motion.     Cervical back: Normal range of motion and neck supple.  Lymphadenopathy:     Cervical: No cervical adenopathy.  Skin:    General: Skin is warm  and dry.     Findings: No rash.  Neurological:     Mental Status: She is alert and oriented to person, place, and time.     Comments: Motor 5/5 all extremities Sensation grossly intact to LT all extremities  no pronator drift     Psychiatric:        Mood and Affect: Mood and affect normal.     ED Results / Procedures / Treatments   Labs (all labs ordered are listed, but only abnormal results are displayed) Labs Reviewed  COMPREHENSIVE METABOLIC PANEL - Abnormal; Notable for the following components:      Result Value   Sodium 132 (*)    CO2 18 (*)    Glucose, Bld 116 (*)    All other components within normal limits  CBC WITH DIFFERENTIAL/PLATELET - Abnormal; Notable for the following components:   Hemoglobin 11.5 (*)    HCT 34.1 (*)    All other components within normal limits  LIPASE, BLOOD  URINALYSIS, ROUTINE W REFLEX MICROSCOPIC    EKG EKG Interpretation  Date/Time:  Tuesday October 13 2020 15:31:23 EST Ventricular Rate:  78 PR Interval:    QRS Duration: 92 QT Interval:  396 QTC Calculation: 452 R Axis:   -12 Text Interpretation: Sinus rhythm Low voltage, precordial leads since last tracing no significant change Confirmed by Malvin Johns [phone]) on 10/13/2020 5:08:58 PM   Radiology No results found.  Procedures Procedures (including critical care time)  Medications Ordered in ED Medications  sodium chloride  0.9 % bolus 500 mL (0 mLs Intravenous Stopped 10/13/20 1905)    ED Course  I have reviewed the triage vital signs and the nursing notes.  Pertinent labs & imaging results that were available during my care of the patient were reviewed by me and considered in my medical decision making (see chart for details).    MDM Rules/Calculators/A&P                          Patient is a 85 year old female who presents after she had an episode of weakness during a bowel movement.  Sounds like she had a vasovagal episode with some near syncope.  Her symptoms resolved after that.  She has recently been treated for Covid infection but she does not have any significant respiratory symptoms.  No hypoxia.  No shortness of breath.  She only has a mild cough.  She has no associate abdominal pain.  She has no ongoing vomiting or diarrhea.  She has had no nausea or vomiting in the ED.  Her labs are nonconcerning.  Her vital signs are stable.  She is able to ambulate without dizziness.  She has no arrhythmias noted on EKG.  She was discharged home in good condition.  Symptomatic care instructions were given. Final Clinical Impression(s) / ED Diagnoses Final diagnoses:  Near syncope    Rx / DC Orders ED Discharge Orders    None       Malvin Johns, MD 10/13/20 2257

## 2020-10-13 NOTE — ED Triage Notes (Signed)
Pt came by EMS. Pt is COVID positive. Pt called out due to being lethargic. EMS gave 500 of fluid. Pt is spanish speaking.   EMS VS  BP- 126/78 HR-92 RR-20 T-97.5 BS-115

## 2021-08-31 ENCOUNTER — Emergency Department (HOSPITAL_COMMUNITY): Payer: Medicare Other

## 2021-08-31 ENCOUNTER — Encounter (HOSPITAL_COMMUNITY): Payer: Self-pay | Admitting: Emergency Medicine

## 2021-08-31 ENCOUNTER — Emergency Department (HOSPITAL_COMMUNITY)
Admission: EM | Admit: 2021-08-31 | Discharge: 2021-08-31 | Disposition: A | Discharge status: Home / Self Care | Payer: Medicare Other | Attending: Emergency Medicine | Admitting: Emergency Medicine

## 2021-08-31 DIAGNOSIS — Z7984 Long term (current) use of oral hypoglycemic drugs: Secondary | ICD-10-CM | POA: Diagnosis not present

## 2021-08-31 DIAGNOSIS — Z20822 Contact with and (suspected) exposure to covid-19: Secondary | ICD-10-CM | POA: Insufficient documentation

## 2021-08-31 DIAGNOSIS — E119 Type 2 diabetes mellitus without complications: Secondary | ICD-10-CM | POA: Insufficient documentation

## 2021-08-31 DIAGNOSIS — J101 Influenza due to other identified influenza virus with other respiratory manifestations: Secondary | ICD-10-CM | POA: Diagnosis not present

## 2021-08-31 DIAGNOSIS — I10 Essential (primary) hypertension: Secondary | ICD-10-CM | POA: Insufficient documentation

## 2021-08-31 DIAGNOSIS — Z79899 Other long term (current) drug therapy: Secondary | ICD-10-CM | POA: Diagnosis not present

## 2021-08-31 DIAGNOSIS — R109 Unspecified abdominal pain: Secondary | ICD-10-CM | POA: Diagnosis present

## 2021-08-31 DIAGNOSIS — E871 Hypo-osmolality and hyponatremia: Secondary | ICD-10-CM | POA: Insufficient documentation

## 2021-08-31 DIAGNOSIS — J111 Influenza due to unidentified influenza virus with other respiratory manifestations: Secondary | ICD-10-CM

## 2021-08-31 LAB — URINALYSIS, ROUTINE W REFLEX MICROSCOPIC
Bilirubin Urine: NEGATIVE
Glucose, UA: NEGATIVE mg/dL
Ketones, ur: NEGATIVE mg/dL
Leukocytes,Ua: NEGATIVE
Nitrite: NEGATIVE
Protein, ur: NEGATIVE mg/dL
Specific Gravity, Urine: 1.005 (ref 1.005–1.030)
pH: 7 (ref 5.0–8.0)

## 2021-08-31 LAB — CBC WITH DIFFERENTIAL/PLATELET
Abs Immature Granulocytes: 0.1 10*3/uL — ABNORMAL HIGH (ref 0.00–0.07)
Basophils Absolute: 0 10*3/uL (ref 0.0–0.1)
Basophils Relative: 0 %
Eosinophils Absolute: 0.1 10*3/uL (ref 0.0–0.5)
Eosinophils Relative: 1 %
HCT: 36.4 % (ref 36.0–46.0)
Hemoglobin: 11.9 g/dL — ABNORMAL LOW (ref 12.0–15.0)
Immature Granulocytes: 1 %
Lymphocytes Relative: 7 %
Lymphs Abs: 0.7 10*3/uL (ref 0.7–4.0)
MCH: 28.1 pg (ref 26.0–34.0)
MCHC: 32.7 g/dL (ref 30.0–36.0)
MCV: 85.8 fL (ref 80.0–100.0)
Monocytes Absolute: 0.8 10*3/uL (ref 0.1–1.0)
Monocytes Relative: 8 %
Neutro Abs: 8.3 10*3/uL — ABNORMAL HIGH (ref 1.7–7.7)
Neutrophils Relative %: 83 %
Platelets: 285 10*3/uL (ref 150–400)
RBC: 4.24 MIL/uL (ref 3.87–5.11)
RDW: 14.1 % (ref 11.5–15.5)
WBC: 9.9 10*3/uL (ref 4.0–10.5)
nRBC: 0 % (ref 0.0–0.2)

## 2021-08-31 LAB — BASIC METABOLIC PANEL
Anion gap: 11 (ref 5–15)
BUN: 16 mg/dL (ref 8–23)
CO2: 20 mmol/L — ABNORMAL LOW (ref 22–32)
Calcium: 9.6 mg/dL (ref 8.9–10.3)
Chloride: 95 mmol/L — ABNORMAL LOW (ref 98–111)
Creatinine, Ser: 0.75 mg/dL (ref 0.44–1.00)
GFR, Estimated: >60 mL/min (ref >60)
Glucose, Bld: 126 mg/dL — ABNORMAL HIGH (ref 70–99)
Potassium: 4.4 mmol/L (ref 3.5–5.1)
Sodium: 126 mmol/L — ABNORMAL LOW (ref 135–145)

## 2021-08-31 LAB — RESP PANEL BY RT-PCR (FLU A&B, COVID) ARPGX2
Influenza A by PCR: POSITIVE — AB
Influenza B by PCR: NEGATIVE
SARS Coronavirus 2 by RT PCR: NEGATIVE

## 2021-08-31 MED ORDER — ONDANSETRON HCL 4 MG/2ML IJ SOLN
4.0000 mg | Freq: Once | INTRAMUSCULAR | Status: AC
  Administered 2021-08-31: 4 mg via INTRAVENOUS
  Filled 2021-08-31: qty 2

## 2021-08-31 MED ORDER — SODIUM CHLORIDE 0.9 % IV BOLUS
1000.0000 mL | Freq: Once | INTRAVENOUS | Status: AC
  Administered 2021-08-31: 1000 mL via INTRAVENOUS

## 2021-08-31 MED ORDER — ONDANSETRON 4 MG PO TBDP: 4.0000 mg | ORAL_TABLET | Freq: Three times a day (TID) | ORAL | 0 refills | Status: AC | PRN

## 2021-08-31 NOTE — ED Provider Notes (Signed)
Emergency Medicine Provider Triage Evaluation Note  Amanda Schneider , a 86 y.o. female  was evaluated in triage.  Pt complains of multiple days of feeling poorly.  Had diarrhea and complained of weakness earlier so they decided to come in.  She felt the same way when she had COVID last year.  Has not done any at home testing.  Review of Systems  Positive: Weakness, diarrhea Negative: Chest pain or shortness of breath  Physical Exam  BP (!) 168/78 (BP Location: Right Arm)   Pulse (!) 104   Temp 98 F (36.7 C) (Oral)   Resp 16   SpO2 99%  Gen:   Awake, no distress  Resp:  Normal effort  MSK:   Moves extremities without difficulty  Other:  Expiratory wheezes.  Tachycardic.  Medical Decision Making  Medically screening exam initiated at 1:39 PM.  Appropriate orders placed.  Amanda Schneider was informed that the remainder of the evaluation will be completed by another provider, this initial triage assessment does not replace that evaluation, and the importance of remaining in the ED until their evaluation is complete.     Amanda Schneider 08/31/21 1343    Amanda Leigh, MD 09/01/21 [phone]

## 2021-08-31 NOTE — ED Triage Notes (Signed)
Per family-states flu like symptoms for 3 days-congestion, fatigue, body aches-states this am woke up with generalized abdominal pain and diarrhea

## 2021-08-31 NOTE — ED Provider Notes (Signed)
[facility] DEPT Provider Note   CSN: [id number] Arrival date & time: 08/31/21  1307     History Chief Complaint  Patient presents with   Abdominal Pain    Amanda Schneider is a 86 y.o. female.   Abdominal Pain Associated symptoms: cough, diarrhea, fever and nausea   Associated symptoms: no chest pain and no dysuria   Patient presents with URI symptoms for the last 3 days.  Congestion fatigue body aches.  Now with abdominal pain diarrhea and some nauseousness.  No vomiting.  Has had contact with grandkids and had an infection but reportedly were negative for flu or COVID.  Decreased appetite.  States she feels weak all over.  Translation comes through the patient's daughter.  Child psychotherapist offered but refused.    Past Medical History:  Diagnosis Date   Anxiety    Arthritis    "knees, arms" (12/15/2014)   Cancer ([place])    Depression    GERD (gastroesophageal reflux disease)    Hypertension    Left breast mass    "just noticed it ~ 3 wks ago" (12/15/2014)   Osteoporosis    Radiation 04/01/15-04/23/15   Left Breast 21 fractions/42.72 Gy   Seizures ([place]) 12/14/2014   "just the one on 12/14/2014) (12/15/2014) due to low sodium   Type II diabetes mellitus ([place])     Patient Active Problem List   Diagnosis Date Noted   Syncope 10/08/2020   Osteoporosis 04/22/2015   Breast cancer, left breast ([place]) 01/20/2015   Syncope and collapse 12/15/2014   Essential hypertension 12/15/2014   Dehydration 12/15/2014   Hyponatremia 12/15/2014   DM II (diabetes mellitus, type II), controlled ([place]) 12/15/2014   Left breast lump 12/15/2014    Past Surgical History:  Procedure Laterality Date   BREAST LUMPECTOMY WITH SENTINEL LYMPH NODE BIOPSY Left 02/03/2015   Procedure: LEFT BREAST LUMPECTOMY WITH SENTINEL LYMPH NODE BX;  Surgeon: Coralie Keens, MD;  Location: [place];  Service: General;  Laterality: Left;   CERVICAL BIOPSY      RE-EXCISION OF BREAST LUMPECTOMY Left 03/02/2015   Procedure: RE-EXCISION OF LEFT BREAST CANCER;  Surgeon: Coralie Keens, MD;  Location: [facility];  Service: General;  Laterality: Left;     OB History   No obstetric history on file.     Family History  Problem Relation Age of Onset   Cancer Mother        unknown type   Seizures Grandson    Heart attack Neg Hx     Social History   Tobacco Use   Smoking status: Never   Smokeless tobacco: Never  Substance Use Topics   Alcohol use: No    Alcohol/week: 0.0 standard drinks   Drug use: No    Home Medications Prior to Admission medications   Medication Sig Start Date End Date Taking? Authorizing Provider  acetaminophen (TYLENOL) 325 MG tablet Take 325-650 mg by mouth every 6 (six) hours as needed for mild pain or headache.   Yes [provider]  Calcium Carb-Cholecalciferol (CALCIUM + D3 PO) Take 1 tablet by mouth 2 (two) times daily.   Yes [provider]  calcium carbonate (ANTACID) 750 MG chewable tablet Chew 1 tablet by mouth as needed for heartburn (or acid reflux).   Yes [provider]  cyclobenzaprine (FLEXERIL) 5 MG tablet Take 5 mg by mouth daily as needed for muscle spasms.   Yes [provider]  linagliptin (TRADJENTA) 5 MG TABS  tablet Take 5 mg by mouth daily.   Yes [provider]  losartan (COZAAR) 100 MG tablet Take 100 mg by mouth daily.   Yes [provider]  metFORMIN (GLUCOPHAGE) 1000 MG tablet Take 1,000 mg by mouth 2 (two) times daily with a meal.   Yes [provider]  ondansetron (ZOFRAN-ODT) 4 MG disintegrating tablet Take 1 tablet (4 mg total) by mouth every 8 (eight) hours as needed for nausea or vomiting. 08/31/21  Yes Davonna Belling, MD  pantoprazole (PROTONIX) 40 MG tablet Take 40 mg by mouth daily as needed (for heartburn).   Yes [provider]  non-metallic deodorant Jethro Poling) MISC Apply 1 application topically  daily.    [provider]    Allergies    Hydrochlorothiazide  Review of Systems   Review of Systems  Constitutional:  Positive for appetite change and fever.  HENT:  Negative for congestion.   Respiratory:  Positive for cough.   Cardiovascular:  Negative for chest pain.  Gastrointestinal:  Positive for abdominal pain, diarrhea and nausea.  Genitourinary:  Negative for dysuria.  Musculoskeletal:  Negative for back pain.  Skin:  Negative for rash.  Neurological:  Negative for weakness.  Psychiatric/Behavioral:  Negative for confusion.    Physical Exam Updated Vital Signs BP 118/63   Pulse 75   Temp 98 F (36.7 C) (Oral)   Resp 20   SpO2 98%   Physical Exam Vitals and nursing note reviewed.  HENT:     Head: Atraumatic.  Cardiovascular:     Rate and Rhythm: Normal rate and regular rhythm.  Pulmonary:     Breath sounds: No wheezing.  Chest:     Chest wall: No tenderness.  Abdominal:     Tenderness: There is no abdominal tenderness.     Hernia: No hernia is present.  Skin:    General: Skin is warm.     Capillary Refill: Capillary refill takes less than 2 seconds.  Neurological:     Mental Status: She is alert and oriented to person, place, and time.    ED Results / Procedures / Treatments   Labs (all labs ordered are listed, but only abnormal results are displayed) Labs Reviewed  RESP PANEL BY RT-PCR (FLU A&B, COVID) ARPGX2 - Abnormal; Notable for the following components:      Result Value   Influenza A by PCR POSITIVE (*)    All other components within normal limits  BASIC METABOLIC PANEL - Abnormal; Notable for the following components:   Sodium 126 (*)    Chloride 95 (*)    CO2 20 (*)    Glucose, Bld 126 (*)    All other components within normal limits  CBC WITH DIFFERENTIAL/PLATELET - Abnormal; Notable for the following components:   Hemoglobin 11.9 (*)    Neutro Abs 8.3 (*)    Abs Immature Granulocytes 0.10 (*)    All other components within  normal limits  URINALYSIS, ROUTINE W REFLEX MICROSCOPIC - Abnormal; Notable for the following components:   Hgb urine dipstick SMALL (*)    Bacteria, UA RARE (*)    All other components within normal limits    EKG None  Radiology DG Chest 2 View  Result Date: 08/31/2021 CLINICAL DATA:  Shortness of breath, flu like symptoms, fatigue, body aches EXAM: CHEST - 2 VIEW COMPARISON:  Chest radiograph 10/07/2020 FINDINGS: The cardiomediastinal silhouette is normal. Lung volumes are low. There is no focal consolidation or pulmonary edema. There is no pleural  effusion or pneumothorax. There is no acute osseous abnormality. IMPRESSION: Low lung volumes. Otherwise, no radiographic evidence of acute cardiopulmonary process. Electronically Signed   By: Valetta Mole M.D.   On: 08/31/2021 14:55    Procedures Procedures   Medications Ordered in ED Medications  ondansetron (ZOFRAN) injection 4 mg (4 mg Intravenous Given 08/31/21 2223)  sodium chloride 0.9 % bolus 1,000 mL (1,000 mLs Intravenous New Bag/Given 08/31/21 2228)    ED Course  I have reviewed the triage vital signs and the nursing notes.  Pertinent labs & imaging results that were available during my care of the patient were reviewed by me and considered in my medical decision making (see chart for details).    MDM Rules/Calculators/A&P                           Patient URI symptoms.  Diarrhea and some nausea.  Fatigue.  Has had about 3 days of symptoms.  Found to be flu positive.  With 3 days of symptoms not a good candidate for Tamiflu.  Does have a hyponatremia.  Fluid bolus given and feels much better.  Is tolerated orals.  Has had hyponatremia in the past.  Will have follow-up with PCP.  Will discharge home Final Clinical Impression(s) / ED Diagnoses Final diagnoses:  Influenza  Hyponatremia    Rx / DC Orders ED Discharge Orders          Ordered    ondansetron (ZOFRAN-ODT) 4 MG disintegrating tablet  Every 8 hours PRN         08/31/21 2306             Davonna Belling, MD 08/31/21 2322

## 2021-08-31 NOTE — Discharge Instructions (Addendum)
Try and keep her self hydrated.  Follow-up with your doctor for your low sodium.  Return for worsening symptoms.

## 2023-09-06 NOTE — Telephone Encounter (Signed)
Telephone call
